# Patient Record
Sex: Female | Born: 1945 | Race: White | Hispanic: No | Marital: Married | Smoking: Never smoker
Health system: Southern US, Community
[De-identification: ages and names within clinical notes are randomized; demographics above are authoritative.]

## PROBLEM LIST (undated history)

## (undated) DIAGNOSIS — R42 Dizziness and giddiness: Secondary | ICD-10-CM

## (undated) DIAGNOSIS — I639 Cerebral infarction, unspecified: Secondary | ICD-10-CM

## (undated) DIAGNOSIS — K219 Gastro-esophageal reflux disease without esophagitis: Secondary | ICD-10-CM

## (undated) DIAGNOSIS — I10 Essential (primary) hypertension: Secondary | ICD-10-CM

## (undated) DIAGNOSIS — E785 Hyperlipidemia, unspecified: Secondary | ICD-10-CM

## (undated) DIAGNOSIS — J189 Pneumonia, unspecified organism: Secondary | ICD-10-CM

## (undated) DIAGNOSIS — A09 Infectious gastroenteritis and colitis, unspecified: Secondary | ICD-10-CM

## (undated) DIAGNOSIS — M1990 Unspecified osteoarthritis, unspecified site: Secondary | ICD-10-CM

## (undated) DIAGNOSIS — F329 Major depressive disorder, single episode, unspecified: Secondary | ICD-10-CM

## (undated) DIAGNOSIS — F32A Depression, unspecified: Secondary | ICD-10-CM

## (undated) DIAGNOSIS — B0229 Other postherpetic nervous system involvement: Secondary | ICD-10-CM

## (undated) DIAGNOSIS — M797 Fibromyalgia: Secondary | ICD-10-CM

## (undated) DIAGNOSIS — F419 Anxiety disorder, unspecified: Secondary | ICD-10-CM

## (undated) HISTORY — PX: TUBAL LIGATION: SHX77

## (undated) HISTORY — DX: Hyperlipidemia, unspecified: E78.5

## (undated) HISTORY — DX: Infectious gastroenteritis and colitis, unspecified: A09

## (undated) HISTORY — DX: Essential (primary) hypertension: I10

## (undated) HISTORY — DX: Depression, unspecified: F32.A

## (undated) HISTORY — DX: Other postherpetic nervous system involvement: B02.29

## (undated) HISTORY — DX: Major depressive disorder, single episode, unspecified: F32.9

---

## 2009-07-03 ENCOUNTER — Ambulatory Visit (HOSPITAL_COMMUNITY): Admission: RE | Admit: 2009-07-03 | Discharge: 2009-07-03 | Payer: Self-pay | Admitting: Internal Medicine

## 2009-07-25 HISTORY — PX: OTHER SURGICAL HISTORY: SHX169

## 2009-08-10 ENCOUNTER — Ambulatory Visit (HOSPITAL_COMMUNITY): Admission: RE | Admit: 2009-08-10 | Discharge: 2009-08-10 | Payer: Self-pay | Admitting: Neurosurgery

## 2012-12-02 ENCOUNTER — Other Ambulatory Visit: Payer: Self-pay | Admitting: Obstetrics and Gynecology

## 2012-12-28 ENCOUNTER — Encounter (HOSPITAL_COMMUNITY): Payer: Self-pay

## 2012-12-28 ENCOUNTER — Encounter (HOSPITAL_COMMUNITY)
Admission: RE | Admit: 2012-12-28 | Discharge: 2012-12-28 | Disposition: A | Discharge status: Home / Self Care | Payer: Medicare Other | Source: Ambulatory Visit | Attending: Obstetrics and Gynecology | Admitting: Obstetrics and Gynecology

## 2012-12-28 ENCOUNTER — Other Ambulatory Visit: Payer: Self-pay

## 2012-12-28 DIAGNOSIS — Z01818 Encounter for other preprocedural examination: Secondary | ICD-10-CM | POA: Insufficient documentation

## 2012-12-28 DIAGNOSIS — Z01812 Encounter for preprocedural laboratory examination: Secondary | ICD-10-CM | POA: Insufficient documentation

## 2012-12-28 HISTORY — DX: Fibromyalgia: M79.7

## 2012-12-28 HISTORY — DX: Gastro-esophageal reflux disease without esophagitis: K21.9

## 2012-12-28 HISTORY — DX: Anxiety disorder, unspecified: F41.9

## 2012-12-28 HISTORY — DX: Unspecified osteoarthritis, unspecified site: M19.90

## 2012-12-28 LAB — CBC
HCT: 37.7 % (ref 36.0–46.0)
Hemoglobin: 13.6 g/dL (ref 12.0–15.0)
MCHC: 36.1 g/dL — ABNORMAL HIGH (ref 30.0–36.0)
MCV: 90 fL (ref 78.0–100.0)
RBC: 4.19 MIL/uL (ref 3.87–5.11)

## 2012-12-28 LAB — BASIC METABOLIC PANEL
BUN: 16 mg/dL (ref 6–23)
CO2: 30 mEq/L (ref 19–32)
Chloride: 83 mEq/L — ABNORMAL LOW (ref 96–112)
Glucose, Bld: 143 mg/dL — ABNORMAL HIGH (ref 70–99)
Potassium: 2.9 mEq/L — ABNORMAL LOW (ref 3.5–5.1)
Sodium: 123 mEq/L — ABNORMAL LOW (ref 135–145)

## 2012-12-28 LAB — SURGICAL PCR SCREEN: Staphylococcus aureus: NEGATIVE

## 2012-12-28 NOTE — Patient Instructions (Addendum)
Your procedure is scheduled on:01/02/13  Enter through the Main Entrance at :0700am Pick up desk phone and dial [phone] and inform us of your arrival.  Please call [phone] if you have any problems the morning of surgery.  Remember: Do not eat or drink after midnight:Tuesday  You may brush your teeth the morning of surgery.  Take these meds the morning of surgery with a sip of water:BP med  DO NOT wear jewelry, eye make-up, lipstick,body lotion, or dark fingernail polish.  (Polished toes are ok)   If you are to be admitted after surgery, leave suitcase in car until your room has been assigned. Patients discharged on the day of surgery will not be allowed to drive home. Wear loose fitting, comfortable clothes for your ride home.

## 2012-12-31 NOTE — Pre-Procedure Instructions (Signed)
Abnormal lab results reported to Dr. Sheral Apley, who wants pt to get clearance from med doctor re low Na, and K+. I notified Stacy at Dr. Kittie Plater office and she will make Dr. Henderson Cloud aware.

## 2012-12-31 NOTE — H&P (Addendum)
67 y.o. yo complains of R ovarian cyst.  Initially it was found on an MRI for back pain.  Pt has had some abdominal cramping and pain.  She has not had any bleeding.  On subsequent [place] the LO was 6.7x6.9x6.9 cm with a 6.6 cm simple cyst without increase in blood flow.  CA 125 is 14.2 and HE was 57, both normal.  EM was 7.22 but EMB was completely benign.    The patient also c/o SUI and urge incontinence.  She had cystometrics done which did show some mixed incontinence but nto straightforward SUI and the urologist did not think she would benefit from TVT.  She understands and wishes to have the ovarian cys/ovary removed now.  Because the uterus is suspended and probably not contributing to her incontinence.  Past Medical History  Diagnosis Date  . Hypertension   . Anxiety   . Mental disorder   . GERD (gastroesophageal reflux disease)     no meds  . Fibromyalgia   . Arthritis     neck   Past Surgical History  Procedure Laterality Date  . Right shoulder  2011  . Tubal ligation    . Bladder repair      History   Social History  . Marital Status: Married    Spouse Name: N/A    Number of Children: N/A  . Years of Education: N/A   Occupational History  . Not on file.   Social History Main Topics  . Smoking status: Never Smoker   . Smokeless tobacco: Not on file  . Alcohol Use: Yes     Comment: occasionally  . Drug Use: No  . Sexually Active: Not on file   Other Topics Concern  . Not on file   Social History Narrative  . No narrative on file    No current facility-administered medications on file prior to encounter.   No current outpatient prescriptions on file prior to encounter.    Allergies  Allergen Reactions  . Statins Other (See Comments)    Muscle aches     @VITALS2 @  Lungs: clear to ascultation Cor:  RRR Abdomen:  soft, nontender, nondistended. Ex:  no cords, erythema Pelvic:  7 cm uterus, without masses felt.  Cystocele -1.  A:  67 yo with 6 cm  simple cyst causing pain desires bilateral salpingoopherectomy withou hysterectomy.   P:  For Robo BSO.   All risks, benefits and alternatives d/w patient and she desires to proceed .  Patient has undergone a modified bowel prep and will receive preop antibiotics and SCDs during the operation.     HORVATH,MICHELLE A  No changes since H&P done.  Will proceed as planned.

## 2013-01-30 ENCOUNTER — Ambulatory Visit (HOSPITAL_COMMUNITY): Payer: Medicare Other | Admitting: Anesthesiology

## 2013-01-30 ENCOUNTER — Ambulatory Visit (HOSPITAL_COMMUNITY)
Admission: RE | Admit: 2013-01-30 | Discharge: 2013-01-30 | Disposition: A | Discharge status: Home / Self Care | Payer: Medicare Other | Source: Ambulatory Visit | Attending: Obstetrics and Gynecology | Admitting: Obstetrics and Gynecology

## 2013-01-30 ENCOUNTER — Encounter (HOSPITAL_COMMUNITY): Payer: Self-pay | Admitting: *Deleted

## 2013-01-30 ENCOUNTER — Encounter (HOSPITAL_COMMUNITY)
Admission: RE | Disposition: A | Discharge status: Home / Self Care | Payer: Self-pay | Source: Ambulatory Visit | Attending: Obstetrics and Gynecology

## 2013-01-30 ENCOUNTER — Encounter (HOSPITAL_COMMUNITY): Payer: Self-pay | Admitting: Anesthesiology

## 2013-01-30 DIAGNOSIS — K219 Gastro-esophageal reflux disease without esophagitis: Secondary | ICD-10-CM | POA: Insufficient documentation

## 2013-01-30 DIAGNOSIS — I10 Essential (primary) hypertension: Secondary | ICD-10-CM | POA: Insufficient documentation

## 2013-01-30 DIAGNOSIS — N83209 Unspecified ovarian cyst, unspecified side: Secondary | ICD-10-CM | POA: Insufficient documentation

## 2013-01-30 HISTORY — PX: ROBOTIC ASSISTED BILATERAL SALPINGO OOPHERECTOMY: SHX6078

## 2013-01-30 LAB — BASIC METABOLIC PANEL
CO2: 27 mEq/L (ref 19–32)
Calcium: 9.9 mg/dL (ref 8.4–10.5)
Chloride: 95 mEq/L — ABNORMAL LOW (ref 96–112)
Creatinine, Ser: 0.82 mg/dL (ref 0.50–1.10)
Glucose, Bld: 108 mg/dL — ABNORMAL HIGH (ref 70–99)
Sodium: 134 mEq/L — ABNORMAL LOW (ref 135–145)

## 2013-01-30 LAB — CBC
Hemoglobin: 14.3 g/dL (ref 12.0–15.0)
MCH: 32.4 pg (ref 26.0–34.0)
MCV: 91.9 fL (ref 78.0–100.0)
RBC: 4.42 MIL/uL (ref 3.87–5.11)
WBC: 7 10*3/uL (ref 4.0–10.5)

## 2013-01-30 SURGERY — ROBOTIC ASSISTED BILATERAL SALPINGO OOPHORECTOMY
Anesthesia: General | Site: Abdomen | Laterality: Bilateral | Wound class: Clean Contaminated

## 2013-01-30 MED ORDER — IBUPROFEN 600 MG PO TABS
600.0000 mg | ORAL_TABLET | Freq: Once | ORAL | Status: AC
  Administered 2013-01-30: 600 mg via ORAL

## 2013-01-30 MED ORDER — MIDAZOLAM HCL 5 MG/5ML IJ SOLN
INTRAMUSCULAR | Status: DC | PRN
  Administered 2013-01-30 (×2): 1 mg via INTRAVENOUS

## 2013-01-30 MED ORDER — OXYCODONE-ACETAMINOPHEN 5-325 MG PO TABS: 1.0000 | ORAL_TABLET | ORAL | Status: DC | PRN

## 2013-01-30 MED ORDER — LIDOCAINE HCL (CARDIAC) 20 MG/ML IV SOLN
INTRAVENOUS | Status: AC
  Filled 2013-01-30: qty 5

## 2013-01-30 MED ORDER — FENTANYL CITRATE 0.05 MG/ML IJ SOLN
INTRAMUSCULAR | Status: DC | PRN
  Administered 2013-01-30 (×4): 50 ug via INTRAVENOUS

## 2013-01-30 MED ORDER — PROMETHAZINE HCL 25 MG/ML IJ SOLN: 6.2500 mg | INTRAMUSCULAR | Status: DC | PRN

## 2013-01-30 MED ORDER — IBUPROFEN 600 MG PO TABS
ORAL_TABLET | ORAL | Status: AC
  Filled 2013-01-30: qty 1

## 2013-01-30 MED ORDER — OXYCODONE-ACETAMINOPHEN 5-325 MG PO TABS
ORAL_TABLET | ORAL | Status: AC
  Filled 2013-01-30: qty 2

## 2013-01-30 MED ORDER — BUPIVACAINE HCL (PF) 0.25 % IJ SOLN
INTRAMUSCULAR | Status: DC | PRN
  Administered 2013-01-30: 4 mL

## 2013-01-30 MED ORDER — NEOSTIGMINE METHYLSULFATE 1 MG/ML IJ SOLN
INTRAMUSCULAR | Status: DC | PRN
  Administered 2013-01-30: 3 mg via INTRAVENOUS

## 2013-01-30 MED ORDER — KETOROLAC TROMETHAMINE 30 MG/ML IJ SOLN: 15.0000 mg | Freq: Once | INTRAMUSCULAR | Status: DC | PRN

## 2013-01-30 MED ORDER — ROCURONIUM BROMIDE 100 MG/10ML IV SOLN
INTRAVENOUS | Status: DC | PRN
  Administered 2013-01-30: 50 mg via INTRAVENOUS
  Administered 2013-01-30: 15 mg via INTRAVENOUS

## 2013-01-30 MED ORDER — MIDAZOLAM HCL 2 MG/2ML IJ SOLN
INTRAMUSCULAR | Status: AC
  Filled 2013-01-30: qty 2

## 2013-01-30 MED ORDER — CEFAZOLIN SODIUM-DEXTROSE 2-3 GM-% IV SOLR
INTRAVENOUS | Status: AC
  Filled 2013-01-30: qty 50

## 2013-01-30 MED ORDER — MIDAZOLAM HCL 2 MG/2ML IJ SOLN: 0.5000 mg | Freq: Once | INTRAMUSCULAR | Status: DC | PRN

## 2013-01-30 MED ORDER — PROPOFOL 10 MG/ML IV BOLUS
INTRAVENOUS | Status: DC | PRN
  Administered 2013-01-30: 150 mg via INTRAVENOUS

## 2013-01-30 MED ORDER — PROPOFOL 10 MG/ML IV EMUL
INTRAVENOUS | Status: AC
  Filled 2013-01-30: qty 20

## 2013-01-30 MED ORDER — ARTIFICIAL TEARS OP OINT
TOPICAL_OINTMENT | OPHTHALMIC | Status: AC
  Filled 2013-01-30: qty 3.5

## 2013-01-30 MED ORDER — ONDANSETRON HCL 4 MG/2ML IJ SOLN
INTRAMUSCULAR | Status: AC
  Filled 2013-01-30: qty 2

## 2013-01-30 MED ORDER — PHENYLEPHRINE HCL 10 MG/ML IJ SOLN: INTRAMUSCULAR | Status: DC | PRN

## 2013-01-30 MED ORDER — ROCURONIUM BROMIDE 50 MG/5ML IV SOLN
INTRAVENOUS | Status: AC
  Filled 2013-01-30: qty 1

## 2013-01-30 MED ORDER — GLYCOPYRROLATE 0.2 MG/ML IJ SOLN
INTRAMUSCULAR | Status: AC
  Filled 2013-01-30: qty 2

## 2013-01-30 MED ORDER — GLYCOPYRROLATE 0.2 MG/ML IJ SOLN
INTRAMUSCULAR | Status: DC | PRN
  Administered 2013-01-30: .4 mg via INTRAVENOUS

## 2013-01-30 MED ORDER — PHENYLEPHRINE HCL 10 MG/ML IJ SOLN
INTRAMUSCULAR | Status: DC | PRN
  Administered 2013-01-30: 80 ug via INTRAVENOUS
  Administered 2013-01-30 (×3): 40 ug via INTRAVENOUS
  Administered 2013-01-30: 80 ug via INTRAVENOUS

## 2013-01-30 MED ORDER — OXYCODONE-ACETAMINOPHEN 5-325 MG PO TABS
1.0000 | ORAL_TABLET | ORAL | Status: DC | PRN
  Administered 2013-01-30: 1 via ORAL

## 2013-01-30 MED ORDER — FENTANYL CITRATE 0.05 MG/ML IJ SOLN
INTRAMUSCULAR | Status: AC
  Filled 2013-01-30: qty 5

## 2013-01-30 MED ORDER — LACTATED RINGERS IR SOLN
Status: DC | PRN
  Administered 2013-01-30: 3000 mL

## 2013-01-30 MED ORDER — MEPERIDINE HCL 25 MG/ML IJ SOLN: 6.2500 mg | INTRAMUSCULAR | Status: DC | PRN

## 2013-01-30 MED ORDER — HYDROMORPHONE HCL PF 1 MG/ML IJ SOLN: 0.2500 mg | INTRAMUSCULAR | Status: DC | PRN

## 2013-01-30 MED ORDER — LIDOCAINE HCL (CARDIAC) 20 MG/ML IV SOLN
INTRAVENOUS | Status: DC | PRN
  Administered 2013-01-30: 50 mg via INTRAVENOUS

## 2013-01-30 MED ORDER — BUPIVACAINE HCL (PF) 0.25 % IJ SOLN
INTRAMUSCULAR | Status: AC
  Filled 2013-01-30: qty 30

## 2013-01-30 MED ORDER — LACTATED RINGERS IV SOLN
INTRAVENOUS | Status: DC
  Administered 2013-01-30 (×2): via INTRAVENOUS

## 2013-01-30 MED ORDER — PHENYLEPHRINE 40 MCG/ML (10ML) SYRINGE FOR IV PUSH (FOR BLOOD PRESSURE SUPPORT)
PREFILLED_SYRINGE | INTRAVENOUS | Status: AC
  Filled 2013-01-30: qty 10

## 2013-01-30 MED ORDER — OXYCODONE-ACETAMINOPHEN 5-325 MG PO TABS
ORAL_TABLET | ORAL | Status: AC
  Administered 2013-01-30: 1 via ORAL
  Filled 2013-01-30: qty 1

## 2013-01-30 MED ORDER — CEFAZOLIN SODIUM-DEXTROSE 2-3 GM-% IV SOLR
2.0000 g | INTRAVENOUS | Status: AC
  Administered 2013-01-30: 2 g via INTRAVENOUS

## 2013-01-30 MED ORDER — ONDANSETRON HCL 4 MG/2ML IJ SOLN
INTRAMUSCULAR | Status: DC | PRN
  Administered 2013-01-30: 4 mg via INTRAVENOUS

## 2013-01-30 SURGICAL SUPPLY — 63 items
BAG URINE DRAINAGE (UROLOGICAL SUPPLIES) ×2 IMPLANT
BARRIER ADHS 3X4 INTERCEED (GAUZE/BANDAGES/DRESSINGS) ×4 IMPLANT
BENZOIN TINCTURE PRP APPL 2/3 (GAUZE/BANDAGES/DRESSINGS) ×2 IMPLANT
CATH FOLEY 2WAY SLVR  5CC 16FR (CATHETERS) ×1
CATH FOLEY 2WAY SLVR 5CC 16FR (CATHETERS) ×1 IMPLANT
CATH FOLEY 3WAY  5CC 16FR (CATHETERS) ×1
CATH FOLEY 3WAY 5CC 16FR (CATHETERS) ×1 IMPLANT
CHLORAPREP W/TINT 26ML (MISCELLANEOUS) ×2 IMPLANT
CLOTH BEACON ORANGE TIMEOUT ST (SAFETY) ×2 IMPLANT
CONT PATH 16OZ SNAP LID 3702 (MISCELLANEOUS) ×2 IMPLANT
COVER MAYO STAND STRL (DRAPES) ×2 IMPLANT
COVER TABLE BACK 60X90 (DRAPES) ×4 IMPLANT
COVER TIP SHEARS 8 DVNC (MISCELLANEOUS) ×1 IMPLANT
COVER TIP SHEARS 8MM DA VINCI (MISCELLANEOUS) ×1
DECANTER SPIKE VIAL GLASS SM (MISCELLANEOUS) ×2 IMPLANT
DERMABOND ADVANCED (GAUZE/BANDAGES/DRESSINGS) ×1
DERMABOND ADVANCED .7 DNX12 (GAUZE/BANDAGES/DRESSINGS) ×1 IMPLANT
DILATOR CANAL MILEX (MISCELLANEOUS) ×2 IMPLANT
DRAPE HUG U DISPOSABLE (DRAPE) ×2 IMPLANT
DRAPE LG THREE QUARTER DISP (DRAPES) ×4 IMPLANT
DRAPE WARM FLUID 44X44 (DRAPE) ×2 IMPLANT
ELECT REM PT RETURN 9FT ADLT (ELECTROSURGICAL) ×2
ELECTRODE REM PT RTRN 9FT ADLT (ELECTROSURGICAL) ×1 IMPLANT
EVACUATOR SMOKE 8.L (FILTER) ×2 IMPLANT
GAUZE VASELINE 3X9 (GAUZE/BANDAGES/DRESSINGS) IMPLANT
GLOVE BIO SURGEON STRL SZ7 (GLOVE) ×4 IMPLANT
GLOVE ECLIPSE 6.5 STRL STRAW (GLOVE) ×6 IMPLANT
GOWN STRL REIN XL XLG (GOWN DISPOSABLE) ×12 IMPLANT
KIT ACCESSORY DA VINCI DISP (KITS) ×1
KIT ACCESSORY DVNC DISP (KITS) ×1 IMPLANT
LEGGING LITHOTOMY PAIR STRL (DRAPES) ×2 IMPLANT
MANIPULATOR UTERINE 4.5 ZUMI (MISCELLANEOUS) ×2 IMPLANT
NEEDLE INSUFFLATION 120MM (ENDOMECHANICALS) ×2 IMPLANT
OCCLUDER COLPOPNEUMO (BALLOONS) ×4 IMPLANT
PACK LAVH (CUSTOM PROCEDURE TRAY) ×2 IMPLANT
PAD PREP 24X48 CUFFED NSTRL (MISCELLANEOUS) ×4 IMPLANT
PLUG CATH AND CAP STER (CATHETERS) ×2 IMPLANT
POUCH SPECIMEN RETRIEVAL 10MM (ENDOMECHANICALS) ×2 IMPLANT
PROTECTOR NERVE ULNAR (MISCELLANEOUS) ×4 IMPLANT
SET CYSTO W/LG BORE CLAMP LF (SET/KITS/TRAYS/PACK) IMPLANT
SET IRRIG TUBING LAPAROSCOPIC (IRRIGATION / IRRIGATOR) ×2 IMPLANT
SOLUTION ELECTROLUBE (MISCELLANEOUS) ×2 IMPLANT
STRIP CLOSURE SKIN 1/2X4 (GAUZE/BANDAGES/DRESSINGS) ×2 IMPLANT
SUT VIC AB 0 CT1 27 (SUTURE) ×5
SUT VIC AB 0 CT1 27XBRD ANBCTR (SUTURE) ×5 IMPLANT
SUT VIC AB 2-0 CT2 27 (SUTURE) ×4 IMPLANT
SUT VICRYL 0 UR6 27IN ABS (SUTURE) ×4 IMPLANT
SUT VICRYL RAPIDE 3 0 (SUTURE) ×4 IMPLANT
SYR 50ML LL SCALE MARK (SYRINGE) ×2 IMPLANT
SYSTEM CONVERTIBLE TROCAR (TROCAR) ×2 IMPLANT
TIP RUMI ORANGE 6.7MMX12CM (TIP) IMPLANT
TIP UTERINE 5.1X6CM LAV DISP (MISCELLANEOUS) IMPLANT
TIP UTERINE 6.7X10CM GRN DISP (MISCELLANEOUS) IMPLANT
TIP UTERINE 6.7X6CM WHT DISP (MISCELLANEOUS) IMPLANT
TIP UTERINE 6.7X8CM BLUE DISP (MISCELLANEOUS) IMPLANT
TOWEL OR 17X24 6PK STRL BLUE (TOWEL DISPOSABLE) ×4 IMPLANT
TROCAR 12M 150ML BLUNT (TROCAR) ×2 IMPLANT
TROCAR DILATING TIP 12MM 150MM (ENDOMECHANICALS) IMPLANT
TROCAR DISP BLADELESS 8 DVNC (TROCAR) ×1 IMPLANT
TROCAR DISP BLADELESS 8MM (TROCAR) ×1
TROCAR XCEL 12X100 BLDLESS (ENDOMECHANICALS) ×2 IMPLANT
TUBING FILTER THERMOFLATOR (ELECTROSURGICAL) ×2 IMPLANT
WATER STERILE IRR 1000ML POUR (IV SOLUTION) ×6 IMPLANT

## 2013-01-30 NOTE — Brief Op Note (Signed)
01/30/2013  3:11 PM  PATIENT:  Alexis Barnes  67 y.o. female  PRE-OPERATIVE DIAGNOSIS:  OVARIAN CYST 620.2  POST-OPERATIVE DIAGNOSIS:  ovarian cyst  PROCEDURE:  Procedure(s): ROBOTIC ASSISTED BILATERAL SALPINGO OOPHORECTOMY (Bilateral)  SURGEON:  Surgeon(s) and Role:    * Philip Aspen, DO - Primary    * W Lodema Hong, MD - Assisting   ANESTHESIA:   local and general  EBL:  Total I/O In: 1600 [I.V.:1600] Out: 510 [Urine:500; Blood:10]  BLOOD ADMINISTERED:none  LOCAL MEDICATIONS USED:  MARCAINE     SPECIMEN:  Source of Specimen:  washings, bilateral tubes and ovaries with L ovarian cyst  DISPOSITION OF SPECIMEN:  PATHOLOGY  COUNTS:  YES  TOURNIQUET:  * No tourniquets in log *   PLAN OF CARE: Discharge to home after PACU  PATIENT DISPOSITION:  PACU - hemodynamically stable.

## 2013-01-30 NOTE — Transfer of Care (Signed)
Immediate Anesthesia Transfer of Care Note  Patient: Alexis Barnes  Procedure(s) Performed: Procedure(s): ROBOTIC ASSISTED BILATERAL SALPINGO OOPHORECTOMY (Bilateral)  Patient Location: PACU  Anesthesia Type:General  Level of Consciousness: awake, alert  and oriented  Airway & Oxygen Therapy: Patient Spontanous Breathing and Patient connected to nasal cannula oxygen  Post-op Assessment: Report given to PACU RN and Post -op Vital signs reviewed and stable  Post vital signs: stable  Complications: No apparent anesthesia complications

## 2013-01-30 NOTE — Op Note (Signed)
Patient was bladder scanned and 31 cc of urine were found in bladder.

## 2013-01-30 NOTE — Discharge Instructions (Signed)
Please call Dr. Claiborne Billings with any problems or questions.  You may have some vaginal bleeding/spotting for several days after the procedure, this is normal.  Please see printed discharge instructions. DISCHARGE INSTRUCTIONS: GYN LAPAROSCOPY The following instructions have been prepared to help you care for yourself upon your return home today. Wound care:  Do not get the incision wet for the first 24 hours. The incision should be kept clean and dry.  The Band-Aids or dressings may be removed the day after surgery.  Should the incision become sore, red, and swollen after the first week, check with your doctor. Personal hygiene:  Use sanitary pads, not tampons.  Shower the day after your procedure.  NO tub baths, pools, or Jacuzzis for 2-3 weeks.  Wipe front to back after using the bathroom. Activity and limitations:  Do NOT drive or operate any equipment today.  Do NOT lift anything more than 15 pounds for 2-3 weeks after surgery.  Do NOT rest in bed all day.  Walking is encouraged. Walk each day, starting slowly with 5-minute walks 3 or 4 times a day. Slowly increase the length of your walks.  Walk up and down stairs slowly.  Do NOT do strenuous activities, such as golfing, playing tennis, bowling, running, biking, weight lifting, gardening, mowing, or vacuuming for 2-4 weeks. Ask your doctor when it is okay to start. Sexual activity: NO intercourse for at least 2 weeks after the procedure. If your laparoscopy was for sterilization (tubes tied), continue current method of birth control until after your next period or ask for specific instructions from your doctor. Diet: Eat a light meal as desired this evening. You may resume your usual diet tomorrow. Return to work: This is dependent on the type of work you do. For the most part you can return to a desk job within a week of surgery. If you are more active at work, please discuss this with your doctor. What to expect after  your surgery: You may have a slight burning sensation when you urinate on the first day. You may have a very small amount of blood in the urine. Expect to have a small amount of vaginal discharge/light bleeding for 1-2 weeks. It is not unusual to have abdominal soreness and bruising for up to 2 weeks. You may be tired and need more rest for about 1 week. You may experience shoulder pain for 24-72 hours. Lying flat in bed may relieve it. Call your doctor for any of the following:  Excessive vaginal bleeding, saturating a pad in less than 2-3 hours  Develop a fever of 100.4 or greater  Inability to urinate 6 hours after discharge from hospital  Unusual vaginal discharge or odor  Severe pain not relieved by pain medications  Persistent of heavy bleeding at incision site  Redness or swelling around incision site after a week  Increasing nausea or vomiting Return to office ___________________________ Call for an appointment ____________________ Additional information: _______________________________________________________________ ___________________________________________________________________________________ Physicians signature: _____________________ Patients signature: ________________________ ________________________________________ Nurses signature _________________________ Post Anesthesia Care Unit [phone] [facility] COUNTY COMMUNITY RESOURCES: *IF YOU ARE IN IMMEDIATE DANGER CALL 911! Abuse/Neglect: Family Services Crisis Hotline [facility]): ............................................................................... [phone] National Domestic Violence Hotline: ....................................................................................................Marland Kitchen [phone] Mental Health: [facility] Mental Health: [facility] Ip.:................................................................................... [phone] Health  Clinics: [facility]) ....................................................................................... [phone] Monday - Friday: 8:00 a.m. - 9:00 p.m.  Saturday - Sunday: 10:00 a.m. - 9:00 p.m. [facility] - [place]: .................................................................................................... [phone]  Monday - Friday: 8:00 a.m. - 5:00 p.m. [facility] [address]: ................................................................................................. [phone] Monday- Friday: 8:30 a.m. - 5:30 p.m.  Saturday: 9:00 a.m. - 1:00 p.m. [place] COMMUNITY RESOURCES: *IF YOU ARE IN IMMEDIATE DANGER CALL 911! Abuse/Neglect: [facility] Against [facility]): ................................................................................. [phone] After hours, holidays and weekends: ............................................................................................ [phone] [facility] Social Services .......................................................................... [phone] [employer] (domestic violence): .......................................................................................................... [phone] National Domestic Violence Hotline: ....................................................................................................Marland Kitchen 202-124-4996 Mental Health: [facility] Mental Health: .................................................................................................... [phone] Health Clinics: [facility]: .......................................................................................................................... [phone] [facility] Department:  ............................................................................................ [phone] Community Resources: [facility] ................................................................................................................. [phone] Council on Aging ................................................................................................................................ [phone] Help for the Homeless ......................................................................................................................... [phone] [facility] ..................................................................................................... [phone] [employer] ................................................................................................................................... [phone]

## 2013-01-30 NOTE — Anesthesia Postprocedure Evaluation (Signed)
  Anesthesia Post-op Note  Anesthesia Post Note  Patient: Alexis Barnes  Procedure(s) Performed: Procedure(s) (LRB): ROBOTIC ASSISTED BILATERAL SALPINGO OOPHORECTOMY (Bilateral)  Anesthesia type: General  Patient location: PACU  Post pain: Pain level controlled  Post assessment: Post-op Vital signs reviewed  Last Vitals:  Filed Vitals:   01/30/13 1600  BP: 110/50  Pulse: 63  Temp:   Resp: 24    Post vital signs: Reviewed  Level of consciousness: sedated  Complications: No apparent anesthesia complications.  Patient was unable to urinate prior to discharge.  Bladder scan by PACU RN showed 31 ml of urine in bladder.  Patient instructed to drink lots of PO fluids and return to hospital in 6 hours if unable to urinate.  Jasmine December, MD

## 2013-01-30 NOTE — Preoperative (Signed)
Beta Blockers   Reason not to administer Beta Blockers:Not Applicable 

## 2013-01-30 NOTE — Discharge Summary (Signed)
  Pt here for same day surgery, underwent RA-BSO which was uncomplicated, please see op report for details.  Anticipate d/c home after PACU recovery with planned office f/u in 2 weeks.

## 2013-01-30 NOTE — Anesthesia Preprocedure Evaluation (Addendum)
Anesthesia Evaluation  Patient identified by MRN, date of birth, ID band Patient awake    Reviewed: Allergy & Precautions, H&P , Patient's Chart, lab work & pertinent test results, reviewed documented beta blocker date and time   History of Anesthesia Complications Negative for: history of anesthetic complications  Airway Mallampati: II TM Distance: >3 FB Neck ROM: full    Dental no notable dental hx.    Pulmonary neg pulmonary ROS,  breath sounds clear to auscultation  Pulmonary exam normal       Cardiovascular Exercise Tolerance: Good hypertension, negative cardio ROS  Rhythm:regular Rate:Normal     Neuro/Psych PSYCHIATRIC DISORDERS Anxiety  Neuromuscular disease negative neurological ROS  negative psych ROS   GI/Hepatic negative GI ROS, Neg liver ROS, GERD-  Controlled,  Endo/Other  negative endocrine ROS  Renal/GU negative Renal ROS     Musculoskeletal  (+) Fibromyalgia -  Abdominal   Peds  Hematology negative hematology ROS (+)   Anesthesia Other Findings Hypertension     Anxiety        Mental disorder     GERD (gastroesophageal reflux disease)   no meds    Fibromyalgia     Arthritis   neck    Reproductive/Obstetrics negative OB ROS                           Anesthesia Physical Anesthesia Plan  ASA: II  Anesthesia Plan: General ETT   Post-op Pain Management:    Induction:   Airway Management Planned: Video Laryngoscope Planned  Additional Equipment:   Intra-op Plan:   Post-operative Plan:   Informed Consent: I have reviewed the patients History and Physical, chart, labs and discussed the procedure including the risks, benefits and alternatives for the proposed anesthesia with the patient or authorized representative who has indicated his/her understanding and acceptance.   Dental Advisory Given  Plan Discussed with: CRNA and Surgeon  Anesthesia Plan Comments:         Anesthesia Quick Evaluation

## 2013-01-31 ENCOUNTER — Encounter (HOSPITAL_COMMUNITY): Payer: Self-pay | Admitting: Obstetrics and Gynecology

## 2013-01-31 MED FILL — Heparin Sodium (Porcine) Inj 5000 Unit/ML: INTRAMUSCULAR | Qty: 1 | Status: AC

## 2013-02-06 NOTE — Op Note (Signed)
NAME:  DORALYN, KIRKES NO.:  MEDICAL RECORD NO.:  [id number]  LOCATION:                                 FACILITY:  PHYSICIAN:  Philip Aspen, DO    DATE OF BIRTH:  [birth year]  DATE OF PROCEDURE: DATE OF DISCHARGE:                              OPERATIVE REPORT   PREOPERATIVE DIAGNOSIS:  Left ovarian cyst.  POSTOPERATIVE DIAGNOSIS:  Left ovarian cyst.  PROCEDURE:  Robotic-assisted bilateral salpingo-oophorectomy.  SURGEON:  Philip Aspen, DO  ASSISTANT:  Nickie Retort, MD  ANESTHESIA:  General and local.  ESTIMATED BLOOD LOSS:  10 mL.  URINE OUTPUT:  500 mL.  INTRAVENOUS FLUIDS:  1600 mL.  LOCAL ANESTHETIC USED AT SKIN INCISION:  Marcaine.  SPECIMENS:  Washings, bilateral tubes and ovaries with left ovarian cysts.  FINDINGS:  Normal-appearing uterus and right adnexa, large left ovarian cyst.  No other abnormal findings.  COMPLICATIONS:  None.  CONDITION:  Stable to PACU.  DESCRIPTION OF PROCEDURE:  The patient was taken to the operating room, where general anesthesia was administered and found to be adequate.  She was then prepped and draped in the normal sterile fashion in dorsal lithotomy position.  A speculum was placed in the patient's vagina after bimanual examination.  The anterior lip of the cervix was grasped with a single-tooth tenaculum.  The cervix was sounded to approximately 8 cm and serially dilated to 25 Pratt.  A ZUMI uterine manipulator was entered through the cervical canal and bulb insufflated.  Single-tooth tenaculum was removed and proper placement was confirmed.  Attention was turned to the abdomen, where approximately 12 mm incision was made after placement of Marcaine supraumbilically in a horizontal fashion with a scalpel.  A Veress needle was entered, and saline drop test confirmed entrance through the peritoneum.  Cath was placed on low flow.  Low pressure was noted and increased to high flow.  The abdomen  was insufflated sufficiently, and the Veress needle was removed.  An Optiview trocar was used to visualize entrance into the abdomen, which was surveyed.  Two additional ports were placed approximately 10 cm laterally and slightly caudally.  These were approximately 8 mm incisions that were made with a scalpel through the skin, after Marcaine was placed.  Trocars were entered with direct visualization.  An additional 5-mm port was placed in the right lower quadrant under direct visualization.  The robotic arms were then attached after placing the patient in Trendelenburg.  The ureters were observed bilaterally. Attention was turned to the right adnexa.  The IP was identified, and bipolar cautery was used to transect followed by monopolar scissors. Bipolar cautery was then used to remove the right fallopian tube and utero-ovarian ligament and the separation was extended with bipolar cautery to the IP ligament.  The adnexa was placed in the posterior cul- de-sac.  Attention was then turned to the left side, where in a similar fashion, the IP was isolated.  Bipolar cautery used, followed by monopolar scissors.  Again, the utero-ovarian ligament was also transected with bipolar cautery and separation brought laterally to the initial IP.  All areas were examined and found to be hemostatic. Robotic arms  were detached and 8-mm camera was entered through the left port.  An endobag was used to retrieve the enlarged ovarian cyst complex.  This was brought to the surface, and fluid was removed from the cyst at the level of the skin with the syringe.  Approximately 200 mL of ovarian fluid were removed decompressing sufficiently to assist in intact removal of the left adnexa.  The right adnexa was then removed through the umbilical port without difficulty.  Again, the abdomen was surveyed.  Hemostasis was noted.  The abdomen was desufflated.  The patient was returned to dorsal lithotomy position from  Trendelenburg. All ports were removed.  The umbilical fascia was closed with 2-0 Vicryl and a figure-of-eight.  All port sites were closed with Monocryl subcuticularly.  Subcuticular incisions sites received Dermabond for further closure.  The patient tolerated the procedure well.  Sponge, lap, and needle counts were correct x2.  Specimens were sent to pathology.  Of note, washings were obtained at the start of the case.          ______________________________ Philip Aspen, DO     [place]/MEDQ  D:  02/05/2013  T:  02/06/2013  Job:  [id number]

## 2013-03-01 ENCOUNTER — Other Ambulatory Visit: Payer: Self-pay | Admitting: Obstetrics and Gynecology

## 2013-09-25 ENCOUNTER — Encounter (INDEPENDENT_AMBULATORY_CARE_PROVIDER_SITE_OTHER): Payer: Self-pay | Admitting: *Deleted

## 2013-10-27 ENCOUNTER — Encounter: Payer: Self-pay | Admitting: Cardiology

## 2013-11-21 ENCOUNTER — Encounter (INDEPENDENT_AMBULATORY_CARE_PROVIDER_SITE_OTHER): Payer: Self-pay | Admitting: *Deleted

## 2013-12-11 ENCOUNTER — Other Ambulatory Visit (INDEPENDENT_AMBULATORY_CARE_PROVIDER_SITE_OTHER): Payer: Self-pay | Admitting: *Deleted

## 2013-12-11 ENCOUNTER — Encounter (INDEPENDENT_AMBULATORY_CARE_PROVIDER_SITE_OTHER): Payer: Self-pay | Admitting: Internal Medicine

## 2013-12-11 ENCOUNTER — Encounter (INDEPENDENT_AMBULATORY_CARE_PROVIDER_SITE_OTHER): Payer: Self-pay | Admitting: *Deleted

## 2013-12-11 ENCOUNTER — Ambulatory Visit (INDEPENDENT_AMBULATORY_CARE_PROVIDER_SITE_OTHER): Payer: Medicare Other | Admitting: Internal Medicine

## 2013-12-11 ENCOUNTER — Telehealth (INDEPENDENT_AMBULATORY_CARE_PROVIDER_SITE_OTHER): Payer: Self-pay | Admitting: *Deleted

## 2013-12-11 VITALS — BP 112/64 | HR 60 | Temp 98.4°F | Ht 64.0 in | Wt 180.3 lb

## 2013-12-11 DIAGNOSIS — IMO0001 Reserved for inherently not codable concepts without codable children: Secondary | ICD-10-CM

## 2013-12-11 DIAGNOSIS — Z800 Family history of malignant neoplasm of digestive organs: Secondary | ICD-10-CM

## 2013-12-11 DIAGNOSIS — K219 Gastro-esophageal reflux disease without esophagitis: Secondary | ICD-10-CM | POA: Insufficient documentation

## 2013-12-11 DIAGNOSIS — F329 Major depressive disorder, single episode, unspecified: Secondary | ICD-10-CM | POA: Insufficient documentation

## 2013-12-11 DIAGNOSIS — F3289 Other specified depressive episodes: Secondary | ICD-10-CM

## 2013-12-11 DIAGNOSIS — I10 Essential (primary) hypertension: Secondary | ICD-10-CM

## 2013-12-11 DIAGNOSIS — F32A Depression, unspecified: Secondary | ICD-10-CM

## 2013-12-11 DIAGNOSIS — M797 Fibromyalgia: Secondary | ICD-10-CM | POA: Insufficient documentation

## 2013-12-11 NOTE — Telephone Encounter (Addendum)
Sample of movi prep given to patient

## 2013-12-11 NOTE — Patient Instructions (Signed)
Colonoscopy with Dr. Rehman 

## 2013-12-11 NOTE — Progress Notes (Addendum)
Subjective:     Patient ID: Alexis KinsmanCarolyn H Kranz, female   DOB: 1945-09-17, 68 y.o.   MRN: [id number]  HPI Referred to our office by Dr. Sherryll BurgerShah for a screening colonoscopy. Her last colonoscopy was seven years ago. She underwent for abdominal pain. The colonoscopy was normal (Dr. Gabriel CirrieMason). Family hx of colon cancer in a sister in her 2940s. Appetite is good. No dysphagia. Occasionally acid reflux. She says she has a hiatal hernia.   [facility]. She had diarrhea an her WBC was elevated. [facility]    She did not undergo a colonoscopy because of the colitis. C diff was negative. Stool studies were negative. She was discharged after 7 days. She was diagnosed also with CHF and placed on Lasix. She apparently stayed  [facility] for Re[facility]. She has a BM every day or every other day. No melena or bright red rectal bleeding.   08/16/2013 EGD: [facility] by Dr. Gibson RampNyree Thorne Impressions: 1. Small hiatus hernia at the gastroesophageal junction 2. Otherwise normal EGD.       Review of Systems Past Medical History  Diagnosis Date  . Hypertension   . Anxiety   . Mental disorder     depression  . GERD (gastroesophageal reflux disease)     no meds  . Fibromyalgia   . Arthritis     neck    Past Surgical History  Procedure Laterality Date  . Right shoulder  2011  . Tubal ligation    . Bladder repair    . Vaginal delivery  [id number]  . Robotic assisted bilateral salpingo oopherectomy Bilateral 01/30/2013    Procedure: ROBOTIC ASSISTED BILATERAL SALPINGO OOPHORECTOMY;  Surgeon: Philip AspenSidney Callahan, DO;  Location: [facility];  Service: Gynecology;  Laterality: Bilateral;    Allergies  Allergen Reactions  . Statins Other (See Comments)    Muscle aches     Current Outpatient Prescriptions on File Prior to Visit  Medication Sig Dispense Refill  . lisinopril (PRINIVIL,ZESTRIL) 20 MG tablet  Take 10 mg by mouth daily.       No current facility-administered medications on file prior to visit.    Married, Two children in good health. Retired from [employer].     Objective:   Physical Exam  Filed Vitals:   12/11/13 1457  BP: 112/64  Pulse: 60  Temp: 98.4 F (36.9 C)  Height: 5\' 4"  (1.626 m)  Weight: 180 lb 4.8 oz (81.784 kg)   Alert and oriented. Skin warm and dry. Oral mucosa is moist.   . Sclera anicteric, conjunctivae is pink. Thyroid not enlarged. No cervical lymphadenopathy. Lungs clear. Heart regular rate and rhythm.  Abdomen is soft. Bowel sounds are positive. No hepatomegaly. No abdominal masses felt. No tenderness.  No edema to lower extremities.       Assessment:    Family hx of colon cancer in a sister age 70. Her last colonoscopy was 7 yrs ago by Dr. Gabriel CirrieMason and she reports it was normal. No GI symptoms at this time.    Plan:     Colonoscopy. The risks and benefits such as perforation, bleeding, and infection were reviewed with the patient and is agreeable.

## 2013-12-12 ENCOUNTER — Encounter (HOSPITAL_COMMUNITY): Payer: Self-pay | Admitting: Pharmacy Technician

## 2013-12-27 ENCOUNTER — Encounter (HOSPITAL_COMMUNITY): Payer: Self-pay | Admitting: *Deleted

## 2013-12-27 ENCOUNTER — Encounter (HOSPITAL_COMMUNITY)
Admission: RE | Disposition: A | Discharge status: Home / Self Care | Payer: Self-pay | Source: Ambulatory Visit | Attending: Internal Medicine

## 2013-12-27 ENCOUNTER — Ambulatory Visit (HOSPITAL_COMMUNITY)
Admission: RE | Admit: 2013-12-27 | Discharge: 2013-12-27 | Disposition: A | Discharge status: Home / Self Care | Payer: Medicare Other | Source: Ambulatory Visit | Attending: Internal Medicine | Admitting: Internal Medicine

## 2013-12-27 DIAGNOSIS — K5730 Diverticulosis of large intestine without perforation or abscess without bleeding: Secondary | ICD-10-CM

## 2013-12-27 DIAGNOSIS — Q438 Other specified congenital malformations of intestine: Secondary | ICD-10-CM | POA: Insufficient documentation

## 2013-12-27 DIAGNOSIS — Z79899 Other long term (current) drug therapy: Secondary | ICD-10-CM | POA: Insufficient documentation

## 2013-12-27 DIAGNOSIS — F3289 Other specified depressive episodes: Secondary | ICD-10-CM | POA: Insufficient documentation

## 2013-12-27 DIAGNOSIS — M129 Arthropathy, unspecified: Secondary | ICD-10-CM | POA: Insufficient documentation

## 2013-12-27 DIAGNOSIS — Z800 Family history of malignant neoplasm of digestive organs: Secondary | ICD-10-CM | POA: Insufficient documentation

## 2013-12-27 DIAGNOSIS — IMO0001 Reserved for inherently not codable concepts without codable children: Secondary | ICD-10-CM | POA: Insufficient documentation

## 2013-12-27 DIAGNOSIS — F329 Major depressive disorder, single episode, unspecified: Secondary | ICD-10-CM | POA: Insufficient documentation

## 2013-12-27 DIAGNOSIS — F411 Generalized anxiety disorder: Secondary | ICD-10-CM | POA: Insufficient documentation

## 2013-12-27 DIAGNOSIS — Z1211 Encounter for screening for malignant neoplasm of colon: Secondary | ICD-10-CM | POA: Insufficient documentation

## 2013-12-27 DIAGNOSIS — K219 Gastro-esophageal reflux disease without esophagitis: Secondary | ICD-10-CM | POA: Insufficient documentation

## 2013-12-27 DIAGNOSIS — I10 Essential (primary) hypertension: Secondary | ICD-10-CM | POA: Insufficient documentation

## 2013-12-27 DIAGNOSIS — K644 Residual hemorrhoidal skin tags: Secondary | ICD-10-CM

## 2013-12-27 DIAGNOSIS — Z888 Allergy status to other drugs, medicaments and biological substances status: Secondary | ICD-10-CM | POA: Insufficient documentation

## 2013-12-27 HISTORY — PX: COLONOSCOPY: SHX5424

## 2013-12-27 SURGERY — COLONOSCOPY
Anesthesia: Moderate Sedation

## 2013-12-27 MED ORDER — SODIUM CHLORIDE 0.9 % IV SOLN
INTRAVENOUS | Status: DC
  Administered 2013-12-27: 09:00:00 via INTRAVENOUS

## 2013-12-27 MED ORDER — MIDAZOLAM HCL 5 MG/5ML IJ SOLN
INTRAMUSCULAR | Status: DC | PRN
  Administered 2013-12-27: 2 mg via INTRAVENOUS
  Administered 2013-12-27 (×2): 3 mg via INTRAVENOUS
  Administered 2013-12-27 (×2): 2 mg via INTRAVENOUS
  Administered 2013-12-27: 1 mg via INTRAVENOUS
  Administered 2013-12-27: 2 mg via INTRAVENOUS

## 2013-12-27 MED ORDER — MEPERIDINE HCL 50 MG/ML IJ SOLN
INTRAMUSCULAR | Status: AC
  Filled 2013-12-27: qty 1

## 2013-12-27 MED ORDER — MIDAZOLAM HCL 5 MG/5ML IJ SOLN
INTRAMUSCULAR | Status: AC
  Filled 2013-12-27: qty 10

## 2013-12-27 MED ORDER — SIMETHICONE 40 MG/0.6ML PO SUSP
ORAL | Status: DC | PRN
  Administered 2013-12-27: 10:00:00

## 2013-12-27 MED ORDER — MIDAZOLAM HCL 5 MG/5ML IJ SOLN
INTRAMUSCULAR | Status: AC
  Filled 2013-12-27: qty 5

## 2013-12-27 MED ORDER — MEPERIDINE HCL 50 MG/ML IJ SOLN
INTRAMUSCULAR | Status: DC | PRN
  Administered 2013-12-27 (×2): 25 mg via INTRAVENOUS

## 2013-12-27 NOTE — Progress Notes (Signed)
Colonoscopy to ascending colon, unable to reach cecum

## 2013-12-27 NOTE — Op Note (Signed)
COLONOSCOPY PROCEDURE REPORT  PATIENT:  Alexis Barnes  MR#:  [id number] Birthdate:  11/28/1945, 68 y.o., female Endoscopist:  Dr. Malissa Hippo, MD Referred By:  Dr. Lanier Clam, MD Procedure Date: 12/27/2013  Procedure:   Colonoscopy  Indications:  Patient is 68 year old Caucasian female who is undergoing screening colonoscopy. Her sister and colon cancer in her 16s. Patient's last colonoscopy was 7 years ago elsewhere.  Informed Consent:  The procedure and risks were reviewed with the patient and informed consent was obtained.  Medications:  Demerol 50 mg IV Versed 15 mg IV  Description of procedure:  After a digital rectal exam was performed, that colonoscope was advanced from the anus through the rectum and colon to the area of the ascending colon. Scope could not be advanced to cecum. She had thick liquid stool in ascending colon limiting the exam. scope was slowly and cautiously withdrawn. The mucosal surfaces were carefully surveyed utilizing scope tip to flexion to facilitate fold flattening as needed. The scope was pulled down into the rectum where a thorough exam including retroflexion was performed.  Findings:  Prep satisfactory she had thick liquid stool in ascending colon. Cecum not reached. Tortuous colon with inability to prevent loop formation. Focal scarring noted at sigmoid colon(patient had colitis in January 2015). Few diverticula at sigmoid colon. Normal rectal mucosa. Small hemorrhoids below the dentate line.   Therapeutic/Diagnostic Maneuvers Performed:  None  Complications:  None  Cecal Withdrawal Time:  NA  Impression:  Examination performed to ascending colon but cecum could not be reached. Tortuous colon resulting in loop formation which could not be reduced. Mild sigmoid colon diverticulosis. External hemorrhoids.  Recommendations:  Standard instructions given. Will schedule virtual colonoscopy later this month. Will consider next colonoscopy  with propofol as patient difficult to sedate.  Malissa Hippo  12/27/2013 11:30 AM  CC: Dr. Kirstie Peri, MD & Dr. Bonnetta Barry ref. provider found

## 2013-12-27 NOTE — Discharge Instructions (Signed)
Resume usual medications and diet. No driving for 24 hours. Will schedule a virtual colonoscopy later this month. Office will call.  Colonoscopy, Care After Refer to this sheet in the next few weeks. These instructions provide you with information on caring for yourself after your procedure. Your health care provider may also give you more specific instructions. Your treatment has been planned according to current medical practices, but problems sometimes occur. Call your health care provider if you have any problems or questions after your procedure. WHAT TO EXPECT AFTER THE PROCEDURE  After your procedure, it is typical to have the following:  A small amount of blood in your stool.  Moderate amounts of gas and mild abdominal cramping or bloating. HOME CARE INSTRUCTIONS  Do not drive, operate machinery, or sign important documents for 24 hours.  You may shower and resume your regular physical activities, but move at a slower pace for the first 24 hours.  Take frequent rest periods for the first 24 hours.  Walk around or put a warm pack on your abdomen to help reduce abdominal cramping and bloating.  Drink enough fluids to keep your urine clear or pale yellow.  You may resume your normal diet as instructed by your health care provider. Avoid heavy or fried foods that are hard to digest.  Avoid drinking alcohol for 24 hours or as instructed by your health care provider.  Only take over-the-counter or prescription medicines as directed by your health care provider.  If a tissue sample (biopsy) was taken during your procedure:  Do not take aspirin or blood thinners for 7 days, or as instructed by your health care provider.  Do not drink alcohol for 7 days, or as instructed by your health care provider.  Eat soft foods for the first 24 hours. SEEK MEDICAL CARE IF: You have persistent spotting of blood in your stool 2 3 days after the procedure. SEEK IMMEDIATE MEDICAL CARE  IF:  You have more than a small spotting of blood in your stool.  You pass large blood clots in your stool.  Your abdomen is swollen (distended).  You have nausea or vomiting.  You have a fever.  You have increasing abdominal pain that is not relieved with medicine. Document Released: 02/23/2004 Document Revised: 05/01/2013 Document Reviewed: 03/18/2013 [facility] Patient Information 2014 [place], [place].

## 2013-12-27 NOTE — H&P (Signed)
Alexis Barnes is an 68 y.o. female.   Chief Complaint: Patient is here for colonoscopy. HPI: Patient is 68 year old Caucasian female who is here for a screening colonoscopy. Last exam was 7 years ago and was reportedly normal. She denies abdominal pain change in her bowel habits rectal bleeding. Past January she was treated for upper GI at [facility]. Family history significant for colon carcinoma in her sister was in the 5s at the time of diagnosis and is feeling fine about 30 years later.  Past Medical History  Diagnosis Date  . Hypertension   . Anxiety   . Mental disorder     depression  . GERD (gastroesophageal reflux disease)     no meds  . Fibromyalgia   . Arthritis     neck spurs    Past Surgical History  Procedure Laterality Date  . Right shoulder  2011  . Tubal ligation    . Vaginal delivery  [id number]  . Robotic assisted bilateral salpingo oopherectomy Bilateral 01/30/2013    Procedure: ROBOTIC ASSISTED BILATERAL SALPINGO OOPHORECTOMY;  Surgeon: Philip Aspen, DO;  Location: [facility];  Service: Gynecology;  Laterality: Bilateral;    Family History  Problem Relation Age of Onset  . Colon cancer Sister 61   Social History:  reports that she has never smoked. She does not have any smokeless tobacco history on file. She reports that she does not drink alcohol or use illicit drugs.  Allergies:  Allergies  Allergen Reactions  . Statins Other (See Comments)    Muscle aches     Medications Prior to Admission  Medication Sig Dispense Refill  . furosemide (LASIX) 20 MG tablet Take 20 mg by mouth daily.       . hydrOXYzine (ATARAX/VISTARIL) 25 MG tablet Take 25 mg by mouth 2 (two) times daily as needed for itching.       Marland Kitchen lisinopril (PRINIVIL,ZESTRIL) 20 MG tablet Take 10 mg by mouth daily.      . pregabalin (LYRICA) 25 MG capsule Take 25 mg by mouth 2 (two) times daily.      Marland Kitchen albuterol (PROAIR HFA) 108 (90 BASE) MCG/ACT inhaler Inhale 2 puffs into the lungs every 4  (four) hours as needed for wheezing or shortness of breath.         No results found for this or any previous visit (from the past 48 hour(s)). No results found.  ROS  Blood pressure 127/58, pulse 72, temperature 98.3 F (36.8 C), temperature source Oral, resp. rate 20, height 5\' 4"  (1.626 m), weight 180 lb (81.647 kg), SpO2 99.00%. Physical Exam  Constitutional: She appears well-developed and well-nourished.  HENT:  Mouth/Throat: Oropharynx is clear and moist.  Eyes: Conjunctivae are normal. No scleral icterus.  Neck: No thyromegaly present.  Cardiovascular: Normal rate, regular rhythm and normal heart sounds.   No murmur heard. Respiratory: Effort normal and breath sounds normal.  GI: Soft. She exhibits no distension and no mass. There is no tenderness.  Laparoscopy scars lower abdomen.  Musculoskeletal: She exhibits no edema.  Lymphadenopathy:    She has no cervical adenopathy.  Neurological: She is alert.  Skin: Skin is warm and dry.     Assessment/Plan High risk screening colonoscopy.   Najeeb U Rehman 12/27/2013, 10:20 AM

## 2013-12-31 ENCOUNTER — Encounter (HOSPITAL_COMMUNITY): Payer: Self-pay | Admitting: Internal Medicine

## 2014-01-08 ENCOUNTER — Other Ambulatory Visit (INDEPENDENT_AMBULATORY_CARE_PROVIDER_SITE_OTHER): Payer: Self-pay | Admitting: Internal Medicine

## 2014-01-08 DIAGNOSIS — Q438 Other specified congenital malformations of intestine: Secondary | ICD-10-CM

## 2014-01-15 ENCOUNTER — Encounter (INDEPENDENT_AMBULATORY_CARE_PROVIDER_SITE_OTHER): Payer: Self-pay

## 2014-01-27 ENCOUNTER — Telehealth (INDEPENDENT_AMBULATORY_CARE_PROVIDER_SITE_OTHER): Payer: Self-pay | Admitting: *Deleted

## 2014-01-27 NOTE — Telephone Encounter (Signed)
Ann, please find out if patient interested in this study are not

## 2014-01-27 NOTE — Telephone Encounter (Signed)
FYIAlvino Chapel: [facility] called to let us know she has left 3 messages for patient to call her so they can get her scheduled for virtual TCS, patient hasn't returned any calls

## 2014-01-28 NOTE — Telephone Encounter (Signed)
Spoke to Ms Alexis Barnes and her husband is having some medical issues and she's been busy with him and said once she gets him settled she will call to schedule virtual TCS, Alvino Chapelllen @ [facility] is aware

## 2014-01-28 NOTE — Telephone Encounter (Signed)
Thanks for follow up.

## 2014-05-19 ENCOUNTER — Other Ambulatory Visit (INDEPENDENT_AMBULATORY_CARE_PROVIDER_SITE_OTHER): Payer: Self-pay | Admitting: Internal Medicine

## 2014-05-19 ENCOUNTER — Ambulatory Visit
Admission: RE | Admit: 2014-05-19 | Discharge: 2014-05-19 | Disposition: A | Discharge status: Home / Self Care | Payer: Medicare Other | Source: Ambulatory Visit | Attending: Internal Medicine | Admitting: Internal Medicine

## 2014-05-19 ENCOUNTER — Encounter (INDEPENDENT_AMBULATORY_CARE_PROVIDER_SITE_OTHER): Payer: Self-pay

## 2014-05-19 DIAGNOSIS — Q438 Other specified congenital malformations of intestine: Secondary | ICD-10-CM

## 2014-05-20 ENCOUNTER — Ambulatory Visit
Admission: RE | Admit: 2014-05-20 | Discharge: 2014-05-20 | Disposition: A | Discharge status: Home / Self Care | Payer: Medicare Other | Source: Ambulatory Visit | Attending: Internal Medicine | Admitting: Internal Medicine

## 2014-05-20 DIAGNOSIS — Q438 Other specified congenital malformations of intestine: Secondary | ICD-10-CM

## 2015-05-14 ENCOUNTER — Encounter: Payer: Self-pay | Admitting: Cardiology

## 2015-05-14 ENCOUNTER — Ambulatory Visit (INDEPENDENT_AMBULATORY_CARE_PROVIDER_SITE_OTHER): Payer: Medicare Other | Admitting: Cardiology

## 2015-05-14 VITALS — BP 127/80 | HR 96 | Ht 63.0 in | Wt 189.8 lb

## 2015-05-14 DIAGNOSIS — I779 Disorder of arteries and arterioles, unspecified: Secondary | ICD-10-CM

## 2015-05-14 DIAGNOSIS — I10 Essential (primary) hypertension: Secondary | ICD-10-CM

## 2015-05-14 DIAGNOSIS — Z9189 Other specified personal risk factors, not elsewhere classified: Secondary | ICD-10-CM | POA: Diagnosis not present

## 2015-05-14 DIAGNOSIS — Z87898 Personal history of other specified conditions: Secondary | ICD-10-CM

## 2015-05-14 DIAGNOSIS — I739 Peripheral vascular disease, unspecified: Secondary | ICD-10-CM

## 2015-05-14 DIAGNOSIS — R0602 Shortness of breath: Secondary | ICD-10-CM

## 2015-05-14 NOTE — Patient Instructions (Signed)
Your physician recommends that you continue on your current medications as directed. Please refer to the Current Medication list given to you today. Your physician has requested that you have an echocardiogram. Echocardiography is a painless test that uses sound waves to create images of your heart. It provides your doctor with information about the size and shape of your heart and how well your heart's chambers and valves are working. This procedure takes approximately one hour. There are no restrictions for this procedure. Your physician has requested that you have a carotid duplex. This test is an ultrasound of the carotid arteries in your neck. It looks at blood flow through these arteries that supply the brain with blood. Allow one hour for this exam. There are no restrictions or special instructions. We will call you with your results.

## 2015-05-14 NOTE — Progress Notes (Signed)
Cardiology Office Note  Date: 05/14/2015   ID: Alexis Barnes, Alexis Barnes 1945-10-07, MRN [id number]  Primary care physician: None at present Referring provider: Dr. Azucena Freed, [facility]  Consulting Cardiologist: Nona Dell, MD   Chief Complaint  Patient presents with  . Shortness of Breath    History of Present Illness: Alexis Barnes is a 69 y.o. female referred for cardiology consultation by Dr. Craig Guess.  She is here with her daughter today. I reviewed electronic records through Care Everywhere and also recent office notes from her pain clinic visits.  She presents with several concerns, mentioning to me aspects of her history over the last year and a half, hospitalization in [place] and also [facility] last year, previous syncope when on lisinopril and also complicated by dehydration, subsequent use of Lasix intermittently over the last year, weight gain on Lyrica, and also being told that she had "congestive heart failure."  She does not have a primary care provider at this time, previously saw Dr. Sherryll Burger, but she and her daughter have lost confidence in him and plan to look for a new provider.  I reviewed her medications. She does have an albuterol inhaler which she uses periodically with reported benefit for symptoms of shortness of breath and mild wheezing, but it is not clear that she is ever had any formal PFTs or documented chronic lung disease.  As noted above, she was previously on lisinopril, dose was reduced in the setting of dehydration and syncope, however at some point she came off of the medication entirely. History describes hypertension, but she has not been on a standing antihypertensive medication recently.  I reviewed to echocardiogram reports that she had last year, one at [facility], and one at [facility]. Results overall were fairly reassuring demonstrating normal LVEF and no major valvular abnormalities that would explain  symptoms. She was described on one report as having diastolic dysfunction (ungraded), and it was during her hospitalization at [facility] early last year that she was placed on as needed Lasix.  She also tells me that she has cervical bone spurs and was told based on imaging studies in the past that she had a narrowing involving her right carotid artery.  She reports a history of heart disease in her father who died in his early 71s. No obvious premature cardiovascular disease however.  She states that she has been told in the past that she has borderline diabetes, has not had regular follow-up for this recently however.  ECG today shows sinus rhythm with nonspecific T-wave changes.  Past Medical History  Diagnosis Date  . Essential hypertension   . Anxiety   . Depression   . GERD (gastroesophageal reflux disease)   . Fibromyalgia   . Arthritis   . Postherpetic neuralgia   . Hyperlipidemia   . Infectious colitis     Associated with syncope and sepsis, managed at [facility] January 2015    Past Surgical History  Procedure Laterality Date  . Rotator cuff surgery Right 2011  . Tubal ligation    . Vaginal delivery  [id number]  . Robotic assisted bilateral salpingo oopherectomy Bilateral 01/30/2013    Procedure: ROBOTIC ASSISTED BILATERAL SALPINGO OOPHORECTOMY;  Surgeon: Philip Aspen, DO;  Location: [facility];  Service: Gynecology;  Laterality: Bilateral;  . Colonoscopy N/A 12/27/2013    Procedure: COLONOSCOPY;  Surgeon: Malissa Hippo, MD;  [facility];  Service: Endoscopy;  Laterality: N/A;  955    Current Outpatient Prescriptions  Medication Sig Dispense Refill  . albuterol (PROAIR HFA) 108 (90 BASE) MCG/ACT inhaler Inhale 2 puffs into the lungs every 4 (four) hours as needed for wheezing or shortness of breath.     . B Complex Vitamins (VITAMIN B COMPLEX PO) Take 1 tablet by mouth 2 (two) times daily.    Marland Kitchen HYDROcodone-acetaminophen (NORCO) 7.5-325 MG tablet Take 1 tablet by mouth  as needed.  0  . pregabalin (LYRICA) 25 MG capsule Take 25 mg by mouth 2 (two) times daily.    . Vitamin D, Cholecalciferol, 1000 UNITS TABS Take 1 tablet by mouth 3 (three) times daily.     No current facility-administered medications for this visit.    Allergies:  Statins   Social History: The patient  reports that she has never smoked. She does not have any smokeless tobacco history on file. She reports that she does not drink alcohol or use illicit drugs.   Family History: The patient's family history includes Colon cancer (age of onset: 18) in her sister; Heart attack in her father.   ROS:  Please see the history of present illness. Otherwise, complete review of systems is positive for chronic neck pain with decreased range of motion , anxiety. She has not had any recent syncope. She has postherpetic neuralgia affecting her left thorax and arm. All other systems are reviewed and negative.   Physical Exam: VS:  BP 127/80 mmHg  Pulse 96  Ht  (1.6 m)  Wt 189 lb 12.8 oz (86.093 kg)  BMI 33.63 kg/m2  SpO2 97%, BMI Body mass index is 33.63 kg/(m^2).  Wt Readings from Last 3 Encounters:  05/14/15 189 lb 12.8 oz (86.093 kg)  12/27/13 180 lb (81.647 kg)  12/11/13 180 lb 4.8 oz (81.784 kg)     General:  Overweight woman, appears comfortable at rest. HEENT: Conjunctiva and lids normal, oropharynx clear. Neck: Supple, no elevated JVP or carotid bruits, no thyromegaly. Lungs: Clear to auscultation, nonlabored breathing at rest. Cardiac: Regular rate and rhythm, S4, no significant systolic murmur, no pericardial rub. Abdomen: Soft, nontender, bowel sounds present. Extremities: No pitting edema, distal pulses 2+. Skin: Warm and dry. Musculoskeletal: No kyphosis. Neuropsychiatric: Alert and oriented x3, somewhat anxious.   ECG: Tracing from 10/27/2013 showed normal sinus rhythm.   Recent Labwork:  April 2015: BUN 14, creatinine 0.7, potassium 3.9, hemoglobin 11.5, platelets 313.    Other Studies Reviewed Today:  1. Echocardiogram 08/19/2013 [facility]): FINDINGS:  LEFT VENTRICLE The left ventricular size is normal. There is normal left ventricular wall  thickness. Left ventricular systolic function is normal. LV ejection fraction  = 55-60%. Left ventricular filling pattern is impaired. The left ventricular  wall motion is normal. -  RIGHT VENTRICLE The right ventricle is normal size. The right ventricular systolic function  is normal.  LEFT ATRIUM The left atrial size is normal.  RIGHT ATRIUM  Right atrial size is normal. - AORTIC VALVE Structurally normal aortic valve. No aortic regurgitation is present. - MITRAL VALVE The mitral valve leaflets appear normal. There is trace mitral regurgitation. - TRICUSPID VALVE Structurally normal tricuspid valve. There is trace tricuspid regurgitation. - PULMONIC VALVE The pulmonic valve is not well visualized. - ARTERIES The aortic root is normal size. - VENOUS IVC size was normal. - EFFUSION There is no pericardial effusion. - - MMode/2D Measurements & Calculations IVSd: 1.1 cm LVIDd: 3.9 cm LVPWd: 0.88 cm LVIDs: 2.5 cm LA dim: 3.7 cm Ao root: 2.7 cm EDV(MOD-sp4): 55.9 ml ESV(MOD-sp4): 25.9  ml LVOT diam: 1.5 cm LA area A2: 13.0 cm2 LA area A4: 13.3 cm2 LA length (vol): 4.9 cm LA vol: 30.3 ml LA vol index: 15.5 ml/m2 RA area A4: 7.9 cm2 Doppler Measurements & Calculations MV E max vel: 93.6 cm/sec MV A max vel: 116.9 cm/sec MV E/A: 0.80 Med Peak E' Vel: 12.1 cm/sec Lat Peak E' Vel: 7.7 cm/sec E/Lat E`: 12.1 E/Med E`: 7.8 MV dec time: 0.20 sec SV(LVOT): 51.8 ml Ao max PG: 9.4 mmHg Ao mean PG: 4.8 mmHg Ao V2 VTI: 26.0 cm AVA (VTI): 2.0 cm2 LV V1 VTI: 27.7 cm  2. Echocardiogram 10/28/2013 [facility] reported normal LV wall thickness and chamber size, LVEF 60-65%, mildly sclerotic aortic valve, mildly thickened mitral leaflets with trace mitral regurgitation, RVSP less than 35 mmHg,  no pericardial effusion.   Assessment and Plan:  1. Reported history of diastolic dysfunction based on echocardiogram from last year.  She was placed on Lasix following a hospitalization at [facility] in January 2015.  She is not on diuretic at this time. History includes hypertension, although blood pressure trend over time is not well understood, and she is not on any antihypertensives currently.  She does not have any leg edema now, reports having general weight gain over several months and attributes this to Lyrica which she does not take regularly.  She reports dyspnea on exertion and a feeling that her heart rate increases unusually when she walks, has had no syncope since episodes earlier last year complicated by dehydration and infectious colitis.  I discussed with her the results of her previous evaluation based on record review. ECG today is overall nonspecific. I have recommended an echocardiogram to reassess cardiac structure and function, we will call her with the results.  2. No regular health maintenance. After discussing her symptoms and concerns as well as reviewing prior history, by main recommendation to her today was to establish with a primary care provider who could do a complete physical with lab work and then direct her care to specialists as needed.  3. 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 I did not appreciate a bruit on examination. We will obtain carotid Dopplers for objective evaluation.  4. Previous history of syncope complicated by dehydration. She may have an exaggerated  vasovagal response to contributes as well.   Current medicines were reviewed with the patient today.   Orders Placed This Encounter  Procedures  . EKG 12-Lead  . ECHOCARDIOGRAM COMPLETE    Disposition: Call with results.  Signed, Jonelle SidleSamuel G. Jakia Kennebrew, MD, [facility] 05/14/2015 2:07 PM    [facility] [address],  [place], [place] [address] Phone: [phone]) [phone]; Fax: [phone]) [phone]

## 2015-06-04 ENCOUNTER — Ambulatory Visit (INDEPENDENT_AMBULATORY_CARE_PROVIDER_SITE_OTHER): Payer: Medicare Other

## 2015-06-04 ENCOUNTER — Other Ambulatory Visit: Payer: Self-pay

## 2015-06-04 DIAGNOSIS — R0602 Shortness of breath: Secondary | ICD-10-CM | POA: Diagnosis not present

## 2015-06-04 DIAGNOSIS — I779 Disorder of arteries and arterioles, unspecified: Secondary | ICD-10-CM | POA: Diagnosis not present

## 2015-06-04 DIAGNOSIS — I739 Peripheral vascular disease, unspecified: Principal | ICD-10-CM

## 2015-06-08 ENCOUNTER — Telehealth: Payer: Self-pay | Admitting: *Deleted

## 2015-06-08 NOTE — Telephone Encounter (Signed)
-----   Message from Jonelle SidleSamuel G McDowell, MD sent at 06/05/2015  9:04 AM EST ----- Reviewed report. Overall limited images, but LVEF is in normal range at 60-65%, similarly documented on prior studies. There is evidence of mild, grade 1 diastolic dysfunction which is also similar to prior assessment. Use of a diuretic intermittently could be considered, even a standing diuretic would be a reasonable choice in the setting of hypertension. As we discussed in her office encounter, she needs to establish with a new PCP which was her plan. No additional cardiac testing is anticipated at this time.

## 2015-06-08 NOTE — Telephone Encounter (Signed)
-----   Message from Jonelle SidleSamuel G McDowell, MD sent at 06/05/2015  9:01 AM EST ----- Reviewed report. Reassuring, please let her know that there was evidence of only 1-39% bilateral ICA stenosis. This should not be associated with any symptoms, and does not require further evaluation at this particular time. Would suggest taking a baby aspirin daily and work on management of her lipid status with primary care provider. A follow-up carotid Doppler study could be obtained in the next 2 years for reassessment.

## 2015-06-11 NOTE — Telephone Encounter (Signed)
Patient informed and verbalized understanding of plan. 

## 2016-04-28 ENCOUNTER — Telehealth: Payer: Self-pay | Admitting: Cardiology

## 2016-04-28 NOTE — Telephone Encounter (Signed)
PATIENT would like a call about her test that were done in 2016

## 2016-04-28 NOTE — Telephone Encounter (Signed)
Left message to return call 

## 2016-05-04 NOTE — Telephone Encounter (Signed)
Patient stated that she was going through some papers from last year and found something from us stating she had carotid artery disease.    ----- Message from Jonelle SidleSamuel G McDowell, MD sent at 06/05/2015  9:01 AM EST ----- Reviewed report. Reassuring, please let her know that there was evidence of only 1-39% bilateral ICA stenosis. This should not be associated with any symptoms, and does not require further evaluation at this particular time. Would suggest taking a baby aspirin daily and work on management of her lipid status with primary care provider. A follow-up carotid Doppler study could be obtained in the next 2 years for reassessment.  Reviewed above with patient regarding Dr. Ival BibleMcDowell's findings on last carotid doppler.  Patient verbalized undersanding.

## 2016-07-14 ENCOUNTER — Encounter (HOSPITAL_COMMUNITY): Payer: Self-pay | Admitting: Emergency Medicine

## 2016-07-14 ENCOUNTER — Inpatient Hospital Stay (HOSPITAL_COMMUNITY)
Admission: EM | Admit: 2016-07-14 | Discharge: 2016-07-16 | DRG: 149 | Disposition: A | Discharge status: Home / Self Care | Payer: Medicare Other | Attending: Family Medicine | Admitting: Family Medicine

## 2016-07-14 ENCOUNTER — Emergency Department (HOSPITAL_COMMUNITY): Payer: Medicare Other

## 2016-07-14 DIAGNOSIS — K529 Noninfective gastroenteritis and colitis, unspecified: Secondary | ICD-10-CM | POA: Diagnosis not present

## 2016-07-14 DIAGNOSIS — Z888 Allergy status to other drugs, medicaments and biological substances status: Secondary | ICD-10-CM

## 2016-07-14 DIAGNOSIS — R197 Diarrhea, unspecified: Secondary | ICD-10-CM

## 2016-07-14 DIAGNOSIS — Z8673 Personal history of transient ischemic attack (TIA), and cerebral infarction without residual deficits: Secondary | ICD-10-CM

## 2016-07-14 DIAGNOSIS — F329 Major depressive disorder, single episode, unspecified: Secondary | ICD-10-CM | POA: Diagnosis present

## 2016-07-14 DIAGNOSIS — R2681 Unsteadiness on feet: Secondary | ICD-10-CM

## 2016-07-14 DIAGNOSIS — M797 Fibromyalgia: Secondary | ICD-10-CM | POA: Diagnosis present

## 2016-07-14 DIAGNOSIS — M1990 Unspecified osteoarthritis, unspecified site: Secondary | ICD-10-CM | POA: Diagnosis present

## 2016-07-14 DIAGNOSIS — H8110 Benign paroxysmal vertigo, unspecified ear: Secondary | ICD-10-CM | POA: Diagnosis not present

## 2016-07-14 DIAGNOSIS — R42 Dizziness and giddiness: Secondary | ICD-10-CM

## 2016-07-14 DIAGNOSIS — I639 Cerebral infarction, unspecified: Secondary | ICD-10-CM

## 2016-07-14 DIAGNOSIS — R195 Other fecal abnormalities: Secondary | ICD-10-CM | POA: Diagnosis present

## 2016-07-14 DIAGNOSIS — Z8249 Family history of ischemic heart disease and other diseases of the circulatory system: Secondary | ICD-10-CM

## 2016-07-14 DIAGNOSIS — K219 Gastro-esophageal reflux disease without esophagitis: Secondary | ICD-10-CM | POA: Diagnosis present

## 2016-07-14 DIAGNOSIS — E785 Hyperlipidemia, unspecified: Secondary | ICD-10-CM | POA: Diagnosis present

## 2016-07-14 DIAGNOSIS — Z800 Family history of malignant neoplasm of digestive organs: Secondary | ICD-10-CM

## 2016-07-14 DIAGNOSIS — F419 Anxiety disorder, unspecified: Secondary | ICD-10-CM | POA: Diagnosis present

## 2016-07-14 DIAGNOSIS — I10 Essential (primary) hypertension: Secondary | ICD-10-CM | POA: Diagnosis present

## 2016-07-14 DIAGNOSIS — Z7982 Long term (current) use of aspirin: Secondary | ICD-10-CM

## 2016-07-14 DIAGNOSIS — B0229 Other postherpetic nervous system involvement: Secondary | ICD-10-CM | POA: Diagnosis present

## 2016-07-14 HISTORY — DX: Dizziness and giddiness: R42

## 2016-07-14 LAB — URINALYSIS, ROUTINE W REFLEX MICROSCOPIC
Bacteria, UA: NONE SEEN
Bilirubin Urine: NEGATIVE
GLUCOSE, UA: NEGATIVE mg/dL
HGB URINE DIPSTICK: NEGATIVE
KETONES UR: NEGATIVE mg/dL
NITRITE: NEGATIVE
PH: 6 (ref 5.0–8.0)
PROTEIN: NEGATIVE mg/dL
Specific Gravity, Urine: 1.011 (ref 1.005–1.030)

## 2016-07-14 LAB — CBC WITH DIFFERENTIAL/PLATELET
BASOS ABS: 0 10*3/uL (ref 0.0–0.1)
Basophils Relative: 0 %
EOS ABS: 0.4 10*3/uL (ref 0.0–0.7)
Eosinophils Relative: 4 %
HEMATOCRIT: 42.1 % (ref 36.0–46.0)
HEMOGLOBIN: 14.2 g/dL (ref 12.0–15.0)
Lymphocytes Relative: 34 %
Lymphs Abs: 4 10*3/uL (ref 0.7–4.0)
MCH: 33.2 pg (ref 26.0–34.0)
MCHC: 33.7 g/dL (ref 30.0–36.0)
MCV: 98.4 fL (ref 78.0–100.0)
Monocytes Absolute: 1.1 10*3/uL — ABNORMAL HIGH (ref 0.1–1.0)
Monocytes Relative: 9 %
NEUTROS ABS: 6.3 10*3/uL (ref 1.7–7.7)
Neutrophils Relative %: 53 %
Platelets: 417 10*3/uL — ABNORMAL HIGH (ref 150–400)
RBC: 4.28 MIL/uL (ref 3.87–5.11)
RDW: 12.2 % (ref 11.5–15.5)
WBC: 11.8 10*3/uL — ABNORMAL HIGH (ref 4.0–10.5)

## 2016-07-14 LAB — COMPREHENSIVE METABOLIC PANEL
ALK PHOS: 48 U/L (ref 38–126)
ALT: 19 U/L (ref 14–54)
AST: 21 U/L (ref 15–41)
Albumin: 3.9 g/dL (ref 3.5–5.0)
Anion gap: 8 (ref 5–15)
BUN: 16 mg/dL (ref 6–20)
CO2: 24 mmol/L (ref 22–32)
Calcium: 8.8 mg/dL — ABNORMAL LOW (ref 8.9–10.3)
Chloride: 105 mmol/L (ref 101–111)
Creatinine, Ser: 0.9 mg/dL (ref 0.44–1.00)
GFR calc Af Amer: >60 mL/min (ref >60)
GFR calc non Af Amer: >60 mL/min (ref >60)
GLUCOSE: 101 mg/dL — AB (ref 65–99)
POTASSIUM: 3.7 mmol/L (ref 3.5–5.1)
Sodium: 137 mmol/L (ref 135–145)
TOTAL PROTEIN: 7.2 g/dL (ref 6.5–8.1)
Total Bilirubin: 0.4 mg/dL (ref 0.3–1.2)

## 2016-07-14 LAB — LACTIC ACID, PLASMA: Lactic Acid, Venous: 1.3 mmol/L (ref 0.5–1.9)

## 2016-07-14 LAB — TROPONIN I: Troponin I: <0.03 ng/mL (ref <0.03)

## 2016-07-14 LAB — LIPASE, BLOOD: Lipase: 13 U/L (ref 11–51)

## 2016-07-14 LAB — POC OCCULT BLOOD, ED: Fecal Occult Bld: POSITIVE — AB

## 2016-07-14 MED ORDER — IOPAMIDOL (ISOVUE-300) INJECTION 61%
INTRAVENOUS | Status: AC
  Filled 2016-07-14: qty 30

## 2016-07-14 MED ORDER — PANTOPRAZOLE SODIUM 40 MG PO TBEC: 40.0000 mg | DELAYED_RELEASE_TABLET | Freq: Every day | ORAL | Status: DC

## 2016-07-14 MED ORDER — CIPROFLOXACIN IN D5W 400 MG/200ML IV SOLN
400.0000 mg | Freq: Once | INTRAVENOUS | Status: AC
  Administered 2016-07-14: 400 mg via INTRAVENOUS
  Filled 2016-07-14: qty 200

## 2016-07-14 MED ORDER — ONDANSETRON 8 MG PO TBDP
8.0000 mg | ORAL_TABLET | Freq: Once | ORAL | Status: AC
  Administered 2016-07-14: 8 mg via ORAL
  Filled 2016-07-14: qty 1

## 2016-07-14 MED ORDER — IOPAMIDOL (ISOVUE-300) INJECTION 61%
100.0000 mL | Freq: Once | INTRAVENOUS | Status: AC | PRN
  Administered 2016-07-14: 100 mL via INTRAVENOUS

## 2016-07-14 MED ORDER — SODIUM CHLORIDE 0.9 % IV BOLUS (SEPSIS)
500.0000 mL | Freq: Once | INTRAVENOUS | Status: AC
Start: 2016-07-14 — End: 2016-07-14
  Administered 2016-07-14: 500 mL via INTRAVENOUS

## 2016-07-14 MED ORDER — MECLIZINE HCL 12.5 MG PO TABS
25.0000 mg | ORAL_TABLET | Freq: Once | ORAL | Status: AC
  Administered 2016-07-14: 25 mg via ORAL
  Filled 2016-07-14: qty 2

## 2016-07-14 MED ORDER — PANTOPRAZOLE SODIUM 40 MG PO TBEC
40.0000 mg | DELAYED_RELEASE_TABLET | Freq: Two times a day (BID) | ORAL | Status: DC
  Administered 2016-07-15 – 2016-07-16 (×4): 40 mg via ORAL
  Filled 2016-07-14 (×4): qty 1

## 2016-07-14 MED ORDER — METRONIDAZOLE IN NACL 5-0.79 MG/ML-% IV SOLN
500.0000 mg | Freq: Once | INTRAVENOUS | Status: AC
  Administered 2016-07-14: 500 mg via INTRAVENOUS
  Filled 2016-07-14: qty 100

## 2016-07-14 MED ORDER — DIAZEPAM 2 MG PO TABS
2.0000 mg | ORAL_TABLET | Freq: Once | ORAL | Status: AC
  Administered 2016-07-14: 2 mg via ORAL
  Filled 2016-07-14: qty 1

## 2016-07-14 MED ORDER — SODIUM CHLORIDE 0.9 % IV SOLN
INTRAVENOUS | Status: DC
  Administered 2016-07-14: 1000 mL via INTRAVENOUS

## 2016-07-14 NOTE — H&P (Signed)
History and Physical    Alexis Barnes ZOX:[id number] DOB: 1945-11-15 DOA: 07/14/2016  PCP: Valla Leaver, MD Consultants:  Karilyn Cota, GI (last C-scope 2015) Patient coming from: home - lives with husband; NOK: daughter, [phone]  Chief Complaint: vertigo  HPI: Alexis Barnes is a 70 y.o. 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  She reports that she hasn't felt good all week, kind of weak.  This morning when she tried to get up, everything started spinning, wasn't able to get up.  Was finally able to make it to the bathroom.  Again, about 2pm, more spinning.  Would last a few minutes and resolve.  Happened again at 3:45.  Has h/o vertigo.  Also with h/o diverticulitis.  Hospitalized at [facility] for 15 days in 1/15 for this.  Started having diarrhea last night - before starting the dizzy spells.  Abdominal pain, diarrhea, last stool was about 0100.  Maybe 5 total episodes of diarrhea.  Slight further abdominal pain.  Ongoing indigestion, has been belching - chronically.  No fever.  Able to eat/drink today.  Stools were dark in color.    Swelling in glands under left arm.  Had shingles in 2014.  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  This has been ongoing since then and she has seen multiple physicians about it.  She has been unable to have mammograms since due to persistent neuralgia.   ED Course: Per Dr. Clarene Duke: 2240: Pt given antivert for vertigo. Pt ambulated with continued vertigo symptoms; will dose valium. No vomiting in ED. IV abx started for colitis. Concerned regarding d/c due to continued vertigo symptoms, diarrhea, and pt needing to care for elderly husband with dementia. Dx and testing d/w pt and family. Questions answered. Verb understanding, agreeable to observation admit. T/C to [facility]. Ophelia Charter, case  discussed, including: HPI, pertinent PM/SHx, VS/PE, dx testing, ED course and treatment: Agreeable to admit, requests to write temporary orders, obtain observation medical bed to team [facility].   Review of Systems: As per HPI; otherwise 10 point review of systems reviewed and negative.   Ambulatory Status:  Usually ambualtes withgout difficulty, today afraid of falling due to vertigo  Past Medical History:  Diagnosis Date  . Anxiety   . Arthritis   . Depression   . Essential hypertension   . Fibromyalgia   . GERD (gastroesophageal reflux disease)   . Hyperlipidemia   . Infectious colitis    Associated with syncope and sepsis, managed at [facility] January 2015  . Postherpetic neuralgia   . Vertigo     Past Surgical History:  Procedure Laterality Date  . COLONOSCOPY N/A 12/27/2013   Procedure: COLONOSCOPY;  Surgeon: Malissa Hippo, MD;  Location: [facility];  Service: Endoscopy;  Laterality: N/A;  955  . ROBOTIC ASSISTED BILATERAL SALPINGO OOPHERECTOMY Bilateral 01/30/2013   Procedure: ROBOTIC ASSISTED BILATERAL SALPINGO OOPHORECTOMY;  Surgeon: Philip Aspen, DO;  Location: [facility] ORS;  Service: Gynecology;  Laterality: Bilateral;  . Rotator cuff surgery Right 2011  . TUBAL LIGATION    . VAGINAL DELIVERY  [id number],[id number]    Social History   Social History  . Marital status: Married    Spouse name: N/A  . Number of children: N/A  . Years of education: N/A   Occupational History  . Not on file.   Social History Main Topics  . Smoking status: Never Smoker  .  Smokeless tobacco: Never Used  . Alcohol use No  . Drug use: No  . Sexual activity: Not on file   Other Topics Concern  . Not on file   Social History Narrative  . No narrative on file    Allergies  Allergen Reactions  . Statins Other (See Comments)    Muscle aches     Family History  Problem Relation Age of Onset  . Heart attack Father     Age 72  . Colon cancer Sister [id number]    Prior to Admission medications     Medication Sig Start Date End Date Taking? Authorizing Provider  Ascorbic Acid (VITAMIN C) 100 MG tablet Take 100 mg by mouth daily.   Yes Historical Provider, MD  aspirin 81 MG chewable tablet Chew 81 mg by mouth daily.   Yes Historical Provider, MD  B Complex Vitamins (VITAMIN B COMPLEX PO) Take 1 tablet by mouth 2 (two) times daily.   Yes Historical Provider, MD  ezetimibe (ZETIA) 10 MG tablet Take 10 mg by mouth daily.   Yes Historical Provider, MD  pregabalin (LYRICA) 25 MG capsule Take 25 mg by mouth 2 (two) times daily.   Yes Historical Provider, MD  Vitamin D, Cholecalciferol, 1000 UNITS TABS Take 1 tablet by mouth 3 (three) times daily.   Yes Historical Provider, MD    Physical Exam: Vitals:   07/14/16 2000 07/14/16 2019 07/14/16 2207 07/14/16 2211  BP: 113/91 121/60 (!) 124/54   Pulse: 90 91  90  Resp:  18    Temp:      TempSrc:      SpO2: 96% 97%  97%  Weight:      Height:         General:  Appears calm and comfortable and is NAD Eyes:  PERRL, EOMI, normal lids, iris ENT:  grossly normal hearing, lips & tongue, mmm Neck:  no LAD, masses or thyromegaly Cardiovascular:  RRR, no m/r/g. No LE edema.  Respiratory:  CTA bilaterally, no w/r/r. Normal respiratory effort. Abdomen:  soft, nd, NABS - reports mild bloating and feeling of fullness with palpation Skin:  no rash or induration seen on limited exam Musculoskeletal:  grossly normal tone BUE/BLE, good ROM, no bony abnormality; slight fullness in the left axilla with neuropathic pain with palpation but no significant visible anomalies Psychiatric:  grossly normal mood and affect, speech fluent and appropriate, AOx3 Neurologic:  CN 2-12 grossly intact, moves all extremities in coordinated fashion, sensation intact  Labs on Admission: I have personally reviewed following labs and imaging studies  CBC:  Recent Labs Lab 07/14/16 1946  WBC 11.8*  NEUTROABS 6.3  HGB 14.2  HCT 42.1  MCV 98.4  PLT 417*   Basic  Metabolic Panel:  Recent Labs Lab 07/14/16 1946  NA 137  K 3.7  CL 105  CO2 24  GLUCOSE 101*  BUN 16  CREATININE 0.90  CALCIUM 8.8*   GFR: Estimated Creatinine Clearance: 58 mL/min (by C-G formula based on SCr of 0.9 mg/dL). Liver Function Tests:  Recent Labs Lab 07/14/16 1946  AST 21  ALT 19  ALKPHOS 48  BILITOT 0.4  PROT 7.2  ALBUMIN 3.9    Recent Labs Lab 07/14/16 1947  LIPASE 13   No results for input(s): AMMONIA in the last 168 hours. Coagulation Profile: No results for input(s): INR, PROTIME in the last 168 hours. Cardiac Enzymes:  Recent Labs Lab 07/14/16 1947  TROPONINI <0.03   BNP (last 3 results)  No results for input(s): PROBNP in the last 8760 hours. HbA1C: No results for input(s): HGBA1C in the last 72 hours. CBG: No results for input(s): GLUCAP in the last 168 hours. Lipid Profile: No results for input(s): CHOL, HDL, LDLCALC, TRIG, CHOLHDL, LDLDIRECT in the last 72 hours. Thyroid Function Tests: No results for input(s): TSH, T4TOTAL, FREET4, T3FREE, THYROIDAB in the last 72 hours. Anemia Panel: No results for input(s): VITAMINB12, FOLATE, FERRITIN, TIBC, IRON, RETICCTPCT in the last 72 hours. Urine analysis:    Component Value Date/Time   COLORURINE YELLOW 07/14/2016 1818   APPEARANCEUR HAZY (A) 07/14/2016 1818   LABSPEC 1.011 07/14/2016 1818   PHURINE 6.0 07/14/2016 1818   GLUCOSEU NEGATIVE 07/14/2016 1818   HGBUR NEGATIVE 07/14/2016 1818   BILIRUBINUR NEGATIVE 07/14/2016 1818   KETONESUR NEGATIVE 07/14/2016 1818   PROTEINUR NEGATIVE 07/14/2016 1818   NITRITE NEGATIVE 07/14/2016 1818   LEUKOCYTESUR TRACE (A) 07/14/2016 1818    Creatinine Clearance: Estimated Creatinine Clearance: 58 mL/min (by C-G formula based on SCr of 0.9 mg/dL).  Sepsis Labs: @LABRCNTIP (procalcitonin:4,lacticidven:4) )No results found for this or any previous visit (from the past 240 hour(s)).   Radiological Exams on Admission: Ct Head Wo  Contrast  Result Date: 07/14/2016 CLINICAL DATA:  Initial evaluation for acute dizziness, standing. EXAM: CT HEAD WITHOUT CONTRAST TECHNIQUE: Contiguous axial images were obtained from the base of the skull through the vertex without intravenous contrast. COMPARISON:  None available. FINDINGS: Brain: Cerebral volume within normal limits for age. The superimposed mild chronic microvascular ischemic changes present. No evidence for acute intracranial hemorrhage. No evidence for acute large vessel territory infarct. No mass lesion, midline shift or mass effect. No hydrocephalus. No extra-axial fluid collection. Vascular: No hyperdense vessel. Skull: Scalp soft tissues within normal limits.  Calvarium intact. Sinuses/Orbits: Globes and orbital soft tissues within normal limits. Paranasal sinuses are clear. No mastoid effusion. IMPRESSION: 1. No acute intracranial process identified. 2. Mild chronic small vessel ischemic disease. Electronically Signed   By: Rise Mu M.D.   On: 07/14/2016 22:11   Ct Abdomen Pelvis W Contrast  Result Date: 07/14/2016 CLINICAL DATA:  Initial evaluation for acute diarrhea, black stools. History of diverticulitis. EXAM: CT ABDOMEN AND PELVIS WITH CONTRAST TECHNIQUE: Multidetector CT imaging of the abdomen and pelvis was performed using the standard protocol following bolus administration of intravenous contrast. CONTRAST:  ISOVUE-300 IOPAMIDOL (ISOVUE-300) INJECTION 61% COMPARISON:  None. FINDINGS: Lower chest: Mild subsegmental atelectasis seen dependently within the right lung base. Visualized lungs are otherwise clear. Hepatobiliary: Diffuse hypoattenuation of the liver compatible steatosis. Liver otherwise unremarkable. Gallbladder within normal limits. No biliary dilatation. Pancreas: Fatty atrophy of the pancreas noted. Pancreas otherwise unremarkable without acute inflammatory changes. Spleen: Spleen within normal limits. Adrenals/Urinary Tract: Adrenal glands  are normal. Kidneys equal in size with symmetric enhancement. Subcentimeter hypodensities within the kidneys bilaterally too small characterize, but statistically likely reflects small cyst. No nephrolithiasis, hydronephrosis, or focal enhancing renal mass. Ureters of normal caliber without acute abnormality. Bladder within normal limits. Stomach/Bowel: Small hiatal hernia noted. Stomach otherwise unremarkable. No evidence for bowel obstruction. No acute inflammatory changes seen about the small bowel. Appendix within normal limits. Ascending and transverse colon within normal limits. The descending and sigmoid colon are diffusely decompressed. Mild circumferential wall thickening with subtle hazy stranding suspicious for possible acute colitis given the patient's symptoms. No evidence for associated obstruction or other complication. Vascular/Lymphatic: Normal intravascular enhancement seen throughout the intra-abdominal aorta and its branch vessels. Mild to moderate aorto bi-iliac  atherosclerotic disease. No aneurysm. No adenopathy. Reproductive: Uterus within normal limits. Ovaries appear to be absent. Other: No free air or fluid. Musculoskeletal: No acute osseous abnormality. No worrisome lytic or blastic osseous lesions. IMPRESSION: 1. Mild circumferential wall thickening with hazy fat stranding about the descending and proximal sigmoid colon, suspicious for acute colitis given the patient's symptoms. No evidence for obstruction or other complication identified. 2. No other acute intra-abdominal or pelvic process. 3. Hepatic steatosis. Electronically Signed   By: Rise MuBenjamin  McClintock M.D.   On: 07/14/2016 22:19    EKG: Independently reviewed.  NSR with rate 94;  no evidence of acute ischemia  Assessment/Plan Principal Problem:   Vertigo Active Problems:   GERD (gastroesophageal reflux disease)   Colitis   Heme positive stool   Postherpetic neuralgia   Vertigo -Patient with several problems today,  but the one that seems most acute and bothersome to the patient is actually the vertigo -Due to her unsteady gait, her daughter is concerned about her safety at home tonight. -Has received Meclizine and is going to be given Valium in the ER -Will observe overnight, anticipate discharge tomorrow  Colitis with heme positive stool -Patient with h/o colitis, likely diverticulitis -CT findings support this diagnosis currently -Patient did have episodes of diarrhea with dark bloody stools yesterday but has not had any in almost 24 hours -She is able to tolerate PO intake without difficulty -No fevers or other indication of sepsis physiology -As such, outpatient treatment with PO Cipro/Flagyl would be reasonable -Based on the vertigo and the diverticulitis together, the patient/family would feel more comfortable being observed overnight -Given IV antibiotics in the ER but will transition to PO in the AM -Clear liquids, advance diet as tolerated -Morphine, Zofran prn  GERD -Patient does report bloating, belching -With the dark stools, an upper GI source is also a consideration -She has not had any emesis -Will add PPI BID for now  -Hgb is 14.2, will follow -If any further concern for possible upper GI bleeding source, would then consider GI consult  Postherpetic neuralgia -This problem is obviously bothersome to the patient but has been ongoing for several years now -She understands that we are unable to address this issue as an inpatient -Instead, she is encouraged to follow up with her PCP and to also get a mammogram as an outpatient   DVT prophylaxis:  Lovenox  Code Status: Full - confirmed with patient/family Family Communication: Daughter present throughout evaluation Disposition Plan:  Home once clinically improved Consults called: None  Admission status: It is my clinical opinion that referral for OBSERVATION is reasonable and necessary in this patient based on the above information  provided. The aforementioned taken together are felt to place the patient at high risk for further clinical deterioration. However it is anticipated that the patient may be medically stable for discharge from the hospital within 24 to 48 hours.     Jonah BlueJennifer Kadeisha Betsch MD [facility]  If 7PM-7AM, please contact night-coverage [email or web address] Password [email or web address]  07/14/2016, 11:28 PM

## 2016-07-14 NOTE — ED Triage Notes (Signed)
Pt reports onset of dizziness this am. LKW was 0100 this am. Pt states when she got up at 0100 she had diarrhea and felt like she was going to lose consciousness. Family states pt has hx of diverticulitis and had similar symptoms with last flare. Pt states she feels like the room is spinning. Intermittent episodes of the dizziness today. Pt c/o ear pain.

## 2016-07-14 NOTE — ED Provider Notes (Signed)
[facility]PT Provider Note   CSN: [id number] Arrival date & time: 07/14/16  1711     History   Chief Complaint Chief Complaint  Patient presents with  . Dizziness  . Diarrhea  . Melena    HPI Alexis Barnes is a 70 y.o. female.  HPI  Pt was seen at 1930. Per pt, c/o sudden onset and persistence of multiple intermittent episodes of "dizziness" that began overnight last night. Pt describes the dizziness as "everything is spinning around." Has been associated with nausea and right ear "ache." Pt states she also has had "black stools" and diarrhea for the past few days. Pt describes these symptoms as "when I had diverticulitis." Denies vomiting, no fevers, no rash, no CP/palpitations, no SOB/cough, no headache, no visual changes, no focal motor weakness, no tingling/numbness in extremities, no ataxia, no slurred speech, no facial droop.    Past Medical History:  Diagnosis Date  . Anxiety   . Arthritis   . Depression   . Essential hypertension   . Fibromyalgia   . GERD (gastroesophageal reflux disease)   . Hyperlipidemia   . Infectious colitis    Associated with syncope and sepsis, managed at [facility] January 2015  . Postherpetic neuralgia     Patient Active Problem List   Diagnosis Date Noted  . Family hx of colon cancer 12/11/2013  . Essential hypertension, benign 12/11/2013  . Fibromyalgia 12/11/2013  . GERD (gastroesophageal reflux disease) 12/11/2013  . Depression 12/11/2013    Past Surgical History:  Procedure Laterality Date  . COLONOSCOPY N/A 12/27/2013   Procedure: COLONOSCOPY;  Surgeon: Malissa Hippo, MD;  Location: [facility];  Service: Endoscopy;  Laterality: N/A;  955  . ROBOTIC ASSISTED BILATERAL SALPINGO OOPHERECTOMY Bilateral 01/30/2013   Procedure: ROBOTIC ASSISTED BILATERAL SALPINGO OOPHORECTOMY;  Surgeon: Philip Aspen, DO;  Location: [facility] ORS;  Service: Gynecology;  Laterality: Bilateral;  . Rotator cuff surgery Right 2011  . TUBAL  LIGATION    . VAGINAL DELIVERY  [id number]    OB History    Gravida Para Term Preterm AB Living   2 2 2          SAB TAB Ectopic Multiple Live Births                   Home Medications    Prior to Admission medications   Medication Sig Start Date End Date Taking? Authorizing Provider  albuterol (PROAIR HFA) 108 (90 BASE) MCG/ACT inhaler Inhale 2 puffs into the lungs every 4 (four) hours as needed for wheezing or shortness of breath.  08/21/13   Historical Provider, MD  aspirin 81 MG chewable tablet Chew 81 mg by mouth daily.    Historical Provider, MD  B Complex Vitamins (VITAMIN B COMPLEX PO) Take 1 tablet by mouth 2 (two) times daily.    Historical Provider, MD  HYDROcodone-acetaminophen (NORCO) 7.5-325 MG tablet Take 1 tablet by mouth as needed. 02/07/15   Historical Provider, MD  pregabalin (LYRICA) 25 MG capsule Take 25 mg by mouth 2 (two) times daily.    Historical Provider, MD  Vitamin D, Cholecalciferol, 1000 UNITS TABS Take 1 tablet by mouth 3 (three) times daily.    Historical Provider, MD    Family History Family History  Problem Relation Age of Onset  . Heart attack Father     Age 50  . Colon cancer Sister 8    Social History Social History  Substance Use Topics  . Smoking status: Never Smoker  .  Smokeless tobacco: Never Used  . Alcohol use No     Allergies   Statins   Review of Systems Review of Systems ROS: Statement: All systems negative except as marked or noted in the HPI; Constitutional: Negative for fever and chills. ; ; Eyes: Negative for eye pain, redness and discharge. ; ; ENMT: +right ear pain. Negative for hoarseness, nasal congestion, sinus pressure and sore throat. ; ; Cardiovascular: Negative for chest pain, palpitations, diaphoresis, dyspnea and peripheral edema. ; ; Respiratory: Negative for cough, wheezing and stridor. ; ; Gastrointestinal: +"black stools," diarrhea, nausea. Negative for vomiting, abdominal pain, blood in stool, hematemesis,  jaundice and rectal bleeding. . ; ; Genitourinary: Negative for dysuria, flank pain and hematuria. ; ; Musculoskeletal: Negative for back pain and neck pain. Negative for swelling and trauma.; ; Skin: Negative for pruritus, rash, abrasions, blisters, bruising and skin lesion.; ; Neuro: +"spinning." Negative for headache, lightheadedness and neck stiffness. Negative for weakness, altered level of consciousness, altered mental status, extremity weakness, paresthesias, involuntary movement, seizure and syncope.      Physical Exam Updated Vital Signs BP 113/91   Pulse 90   Temp 98.7 F (37.1 C) (Oral)   Resp 17   Ht 5\' 3"  (1.6 m)   Wt 185 lb (83.9 kg)   SpO2 96%   BMI 32.77 kg/m   20:22 Orthostatic Vital Signs AH  Orthostatic Lying   BP- Lying: 121/60  Pulse- Lying: 92      Orthostatic Sitting  BP- Sitting: 130/73  Pulse- Sitting: 101      Orthostatic Standing at 0 minutes  BP- Standing at 0 minutes: 123/76  Pulse- Standing at 0 minutes: 102     Physical Exam 1935: Physical examination:  Nursing notes reviewed; Vital signs and O2 SAT reviewed;  Constitutional: Well developed, Well nourished, Well hydrated, In no acute distress; Head:  Normocephalic, atraumatic; Eyes: EOMI, PERRL, No scleral icterus; ENMT: Clear fluid behind TM's bilat, no erythema, no purulence, no bulging. Canals clear bilat. +edemetous nasal turbinates bilat with clear rhinorrhea. Mouth and pharynx normal, Mucous membranes moist; Neck: Supple, Full range of motion, No lymphadenopathy; Cardiovascular: Regular rate and rhythm, No gallop; Respiratory: Breath sounds clear & equal bilaterally, No wheezes.  Speaking full sentences with ease, Normal respiratory effort/excursion; Chest: Nontender, Movement normal; Abdomen: Soft, +mild tenderness to palp. No rebound or guarding. Nondistended, Normal bowel sounds; Genitourinary: No CVA tenderness; Extremities: Pulses normal, No tenderness, No edema, No calf edema or  asymmetry.; Neuro: AA&Ox3, Major CN grossly intact. Speech clear.  No facial droop. +left horizontal end gaze fatigable nystagmus. Grips equal. Strength 5/5 equal bilat UE's and LE's.  DTR 2/4 equal bilat UE's and LE's.  No gross sensory deficits.  Normal cerebellar testing bilat UE's (finger-nose) and LE's (heel-shin).; Skin: Color normal, Warm, Dry.   ED Treatments / Results  Labs (all labs ordered are listed, but only abnormal results are displayed)   EKG  EKG Interpretation  Date/Time:  Thursday July 14 2016 18:01:02 EST Ventricular Rate:  94 PR Interval:  148 QRS Duration: 82 QT Interval:  340 QTC Calculation: 425 R Axis:   53 Text Interpretation:  Normal sinus rhythm Normal ECG since last tracing no significant change Confirmed by Effie ShyWENTZ  MD, Mechele CollinELLIOTT ([id number]) on 07/14/2016 6:05:02 PM       Radiology   Procedures Procedures (including critical care time)  Medications Ordered in ED Medications  iopamidol (ISOVUE-300) 61 % injection (not administered)  ondansetron (ZOFRAN-ODT) disintegrating tablet 8 mg (8  mg Oral Given 07/14/16 1959)  meclizine (ANTIVERT) tablet 25 mg (25 mg Oral Given 07/14/16 2000)     Initial Impression / Assessment and Plan / ED Course  I have reviewed the triage vital signs and the nursing notes.  Pertinent labs & imaging results that were available during my care of the patient were reviewed by me and considered in my medical decision making (see chart for details).  MDM Reviewed: previous chart, nursing note and vitals Reviewed previous: labs and ECG Interpretation: labs, ECG, x-ray and CT scan   Results for orders placed or performed during the hospital encounter of 07/14/16  CBC with Differential  Result Value Ref Range   WBC 11.8 (H) 4.0 - 10.5 K/uL   RBC 4.28 3.87 - 5.11 MIL/uL   Hemoglobin 14.2 12.0 - 15.0 g/dL   HCT 21.3 08.6 - 57.8 %   MCV 98.4 78.0 - 100.0 fL   MCH 33.2 26.0 - 34.0 pg   MCHC 33.7 30.0 - 36.0 g/dL   RDW  46.9 62.9 - 52.8 %   Platelets 417 (H) 150 - 400 K/uL   Neutrophils Relative % 53 %   Neutro Abs 6.3 1.7 - 7.7 K/uL   Lymphocytes Relative 34 %   Lymphs Abs 4.0 0.7 - 4.0 K/uL   Monocytes Relative 9 %   Monocytes Absolute 1.1 (H) 0.1 - 1.0 K/uL   Eosinophils Relative 4 %   Eosinophils Absolute 0.4 0.0 - 0.7 K/uL   Basophils Relative 0 %   Basophils Absolute 0.0 0.0 - 0.1 K/uL  Comprehensive metabolic panel  Result Value Ref Range   Sodium 137 135 - 145 mmol/L   Potassium 3.7 3.5 - 5.1 mmol/L   Chloride 105 101 - 111 mmol/L   CO2 24 22 - 32 mmol/L   Glucose, Bld 101 (H) 65 - 99 mg/dL   BUN 16 6 - 20 mg/dL   Creatinine, Ser 4.13 0.44 - 1.00 mg/dL   Calcium 8.8 (L) 8.9 - 10.3 mg/dL   Total Protein 7.2 6.5 - 8.1 g/dL   Albumin 3.9 3.5 - 5.0 g/dL   AST 21 15 - 41 U/L   ALT 19 14 - 54 U/L   Alkaline Phosphatase 48 38 - 126 U/L   Total Bilirubin 0.4 0.3 - 1.2 mg/dL   GFR calc non Af Amer >60 >60 mL/min   GFR calc Af Amer >60 >60 mL/min   Anion gap 8 5 - 15  Lactic acid, plasma  Result Value Ref Range   Lactic Acid, Venous 1.3 0.5 - 1.9 mmol/L  Urinalysis, Routine w reflex microscopic  Result Value Ref Range   Color, Urine YELLOW YELLOW   APPearance HAZY (A) CLEAR   Specific Gravity, Urine 1.011 1.005 - 1.030   pH 6.0 5.0 - 8.0   Glucose, UA NEGATIVE NEGATIVE mg/dL   Hgb urine dipstick NEGATIVE NEGATIVE   Bilirubin Urine NEGATIVE NEGATIVE   Ketones, ur NEGATIVE NEGATIVE mg/dL   Protein, ur NEGATIVE NEGATIVE mg/dL   Nitrite NEGATIVE NEGATIVE   Leukocytes, UA TRACE (A) NEGATIVE   RBC / HPF 0-5 0 - 5 RBC/hpf   WBC, UA 0-5 0 - 5 WBC/hpf   Bacteria, UA NONE SEEN NONE SEEN   Mucous PRESENT    Hyaline Casts, UA PRESENT   Lipase, blood  Result Value Ref Range   Lipase 13 11 - 51 U/L  Troponin I  Result Value Ref Range   Troponin I <0.03 <0.03 ng/mL  POC occult  blood, ED  Result Value Ref Range   Fecal Occult Bld POSITIVE (A) NEGATIVE    Ct Head Wo Contrast Result Date:  07/14/2016 CLINICAL DATA:  Initial evaluation for acute dizziness, standing. EXAM: CT HEAD WITHOUT CONTRAST TECHNIQUE: Contiguous axial images were obtained from the base of the skull through the vertex without intravenous contrast. COMPARISON:  None available. FINDINGS: Brain: Cerebral volume within normal limits for age. The superimposed mild chronic microvascular ischemic changes present. No evidence for acute intracranial hemorrhage. No evidence for acute large vessel territory infarct. No mass lesion, midline shift or mass effect. No hydrocephalus. No extra-axial fluid collection. Vascular: No hyperdense vessel. Skull: Scalp soft tissues within normal limits.  Calvarium intact. Sinuses/Orbits: Globes and orbital soft tissues within normal limits. Paranasal sinuses are clear. No mastoid effusion. IMPRESSION: 1. No acute intracranial process identified. 2. Mild chronic small vessel ischemic disease. Electronically Signed   By: Rise MuBenjamin  McClintock M.D.   On: 07/14/2016 22:11   Ct Abdomen Pelvis W Contrast Result Date: 07/14/2016 CLINICAL DATA:  Initial evaluation for acute diarrhea, black stools. History of diverticulitis. EXAM: CT ABDOMEN AND PELVIS WITH CONTRAST TECHNIQUE: Multidetector CT imaging of the abdomen and pelvis was performed using the standard protocol following bolus administration of intravenous contrast. CONTRAST:  100mL ISOVUE-300 IOPAMIDOL (ISOVUE-300) INJECTION 61% COMPARISON:  None. FINDINGS: Lower chest: Mild subsegmental atelectasis seen dependently within the right lung base. Visualized lungs are otherwise clear. Hepatobiliary: Diffuse hypoattenuation of the liver compatible steatosis. Liver otherwise unremarkable. Gallbladder within normal limits. No biliary dilatation. Pancreas: Fatty atrophy of the pancreas noted. Pancreas otherwise unremarkable without acute inflammatory changes. Spleen: Spleen within normal limits. Adrenals/Urinary Tract: Adrenal glands are normal. Kidneys equal  in size with symmetric enhancement. Subcentimeter hypodensities within the kidneys bilaterally too small characterize, but statistically likely reflects small cyst. No nephrolithiasis, hydronephrosis, or focal enhancing renal mass. Ureters of normal caliber without acute abnormality. Bladder within normal limits. Stomach/Bowel: Small hiatal hernia noted. Stomach otherwise unremarkable. No evidence for bowel obstruction. No acute inflammatory changes seen about the small bowel. Appendix within normal limits. Ascending and transverse colon within normal limits. The descending and sigmoid colon are diffusely decompressed. Mild circumferential wall thickening with subtle hazy stranding suspicious for possible acute colitis given the patient's symptoms. No evidence for associated obstruction or other complication. Vascular/Lymphatic: Normal intravascular enhancement seen throughout the intra-abdominal aorta and its branch vessels. 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 No aneurysm. No adenopathy. Reproductive: Uterus within normal limits. Ovaries appear to be absent. Other: No free air or fluid. Musculoskeletal: No acute osseous abnormality. No worrisome lytic or blastic osseous lesions. IMPRESSION: 1. Mild circumferential wall thickening with hazy fat stranding about the descending and proximal sigmoid colon, suspicious for acute colitis given the patient's symptoms. No evidence for obstruction or other complication identified. 2. No other acute intra-abdominal or pelvic process. 3. Hepatic steatosis. Electronically Signed   By: Rise MuBenjamin  McClintock M.D.   On: 07/14/2016 22:19    2240:  Pt given antivert for vertigo. Pt ambulated with continued vertigo symptoms; will dose valium. No vomiting in ED. IV abx started for colitis. Concerned regarding d/c due to continued vertigo symptoms, diarrhea, and pt needing to care for elderly husband with dementia. Dx and testing d/w pt and family.  Questions  answered.  Verb understanding, agreeable to observation admit.  T/C to [facility] Dr. Ophelia CharterYates, case discussed, including:  HPI, pertinent PM/SHx, VS/PE, dx testing, ED course and treatment:  Agreeable to admit, requests to write temporary orders,  obtain observation medical bed to team [facility].   Final Clinical Impressions(s) / ED Diagnoses   Final diagnoses:  None    New Prescriptions New Prescriptions   No medications on file     Samuel Jester, DO 07/18/16 1659

## 2016-07-15 ENCOUNTER — Observation Stay (HOSPITAL_COMMUNITY): Payer: Medicare Other

## 2016-07-15 DIAGNOSIS — M797 Fibromyalgia: Secondary | ICD-10-CM | POA: Diagnosis present

## 2016-07-15 DIAGNOSIS — Z7982 Long term (current) use of aspirin: Secondary | ICD-10-CM | POA: Diagnosis not present

## 2016-07-15 DIAGNOSIS — B0229 Other postherpetic nervous system involvement: Secondary | ICD-10-CM | POA: Diagnosis present

## 2016-07-15 DIAGNOSIS — K219 Gastro-esophageal reflux disease without esophagitis: Secondary | ICD-10-CM | POA: Diagnosis present

## 2016-07-15 DIAGNOSIS — Z8673 Personal history of transient ischemic attack (TIA), and cerebral infarction without residual deficits: Secondary | ICD-10-CM | POA: Diagnosis not present

## 2016-07-15 DIAGNOSIS — I10 Essential (primary) hypertension: Secondary | ICD-10-CM | POA: Diagnosis present

## 2016-07-15 DIAGNOSIS — E785 Hyperlipidemia, unspecified: Secondary | ICD-10-CM | POA: Diagnosis present

## 2016-07-15 DIAGNOSIS — R42 Dizziness and giddiness: Secondary | ICD-10-CM | POA: Diagnosis not present

## 2016-07-15 DIAGNOSIS — K529 Noninfective gastroenteritis and colitis, unspecified: Secondary | ICD-10-CM | POA: Diagnosis present

## 2016-07-15 DIAGNOSIS — H8110 Benign paroxysmal vertigo, unspecified ear: Secondary | ICD-10-CM | POA: Diagnosis present

## 2016-07-15 DIAGNOSIS — F419 Anxiety disorder, unspecified: Secondary | ICD-10-CM | POA: Diagnosis present

## 2016-07-15 DIAGNOSIS — Z800 Family history of malignant neoplasm of digestive organs: Secondary | ICD-10-CM | POA: Diagnosis not present

## 2016-07-15 DIAGNOSIS — Z888 Allergy status to other drugs, medicaments and biological substances status: Secondary | ICD-10-CM | POA: Diagnosis not present

## 2016-07-15 DIAGNOSIS — F329 Major depressive disorder, single episode, unspecified: Secondary | ICD-10-CM | POA: Diagnosis present

## 2016-07-15 DIAGNOSIS — Z8249 Family history of ischemic heart disease and other diseases of the circulatory system: Secondary | ICD-10-CM | POA: Diagnosis not present

## 2016-07-15 DIAGNOSIS — M1990 Unspecified osteoarthritis, unspecified site: Secondary | ICD-10-CM | POA: Diagnosis present

## 2016-07-15 DIAGNOSIS — I639 Cerebral infarction, unspecified: Secondary | ICD-10-CM | POA: Diagnosis present

## 2016-07-15 LAB — CBC
HCT: 36 % (ref 36.0–46.0)
Hemoglobin: 11.8 g/dL — ABNORMAL LOW (ref 12.0–15.0)
MCH: 32.7 pg (ref 26.0–34.0)
MCHC: 32.8 g/dL (ref 30.0–36.0)
MCV: 99.7 fL (ref 78.0–100.0)
Platelets: 382 10*3/uL (ref 150–400)
RBC: 3.61 MIL/uL — AB (ref 3.87–5.11)
RDW: 12 % (ref 11.5–15.5)
WBC: 9.7 10*3/uL (ref 4.0–10.5)

## 2016-07-15 LAB — BASIC METABOLIC PANEL
ANION GAP: 7 (ref 5–15)
BUN: 12 mg/dL (ref 6–20)
CALCIUM: 7.8 mg/dL — AB (ref 8.9–10.3)
CHLORIDE: 109 mmol/L (ref 101–111)
CO2: 24 mmol/L (ref 22–32)
Creatinine, Ser: 0.67 mg/dL (ref 0.44–1.00)
GFR calc non Af Amer: >60 mL/min (ref >60)
Glucose, Bld: 96 mg/dL (ref 65–99)
Potassium: 3.7 mmol/L (ref 3.5–5.1)
Sodium: 140 mmol/L (ref 135–145)

## 2016-07-15 MED ORDER — ASPIRIN 325 MG PO TABS
325.0000 mg | ORAL_TABLET | Freq: Every day | ORAL | Status: DC
  Administered 2016-07-15 – 2016-07-16 (×2): 325 mg via ORAL
  Filled 2016-07-15 (×2): qty 1

## 2016-07-15 MED ORDER — MECLIZINE HCL 12.5 MG PO TABS
25.0000 mg | ORAL_TABLET | Freq: Three times a day (TID) | ORAL | Status: DC | PRN
  Administered 2016-07-15 – 2016-07-16 (×3): 25 mg via ORAL
  Filled 2016-07-15 (×4): qty 2

## 2016-07-15 MED ORDER — CIPROFLOXACIN HCL 250 MG PO TABS
500.0000 mg | ORAL_TABLET | Freq: Two times a day (BID) | ORAL | Status: DC
  Administered 2016-07-15 – 2016-07-16 (×3): 500 mg via ORAL
  Filled 2016-07-15 (×3): qty 2

## 2016-07-15 MED ORDER — PREGABALIN 25 MG PO CAPS
25.0000 mg | ORAL_CAPSULE | Freq: Two times a day (BID) | ORAL | Status: DC
  Administered 2016-07-15 – 2016-07-16 (×2): 25 mg via ORAL
  Filled 2016-07-15 (×3): qty 1

## 2016-07-15 MED ORDER — STROKE: EARLY STAGES OF RECOVERY BOOK
Freq: Once | Status: AC
  Administered 2016-07-15: 13:00:00
  Filled 2016-07-15: qty 1

## 2016-07-15 MED ORDER — ASPIRIN 81 MG PO CHEW
81.0000 mg | CHEWABLE_TABLET | Freq: Every day | ORAL | Status: DC
  Administered 2016-07-15: 81 mg via ORAL
  Filled 2016-07-15: qty 1

## 2016-07-15 MED ORDER — MORPHINE SULFATE (PF) 2 MG/ML IV SOLN: 2.0000 mg | INTRAVENOUS | Status: DC | PRN

## 2016-07-15 MED ORDER — ONDANSETRON HCL 4 MG PO TABS: 4.0000 mg | ORAL_TABLET | Freq: Four times a day (QID) | ORAL | Status: DC | PRN

## 2016-07-15 MED ORDER — ACETAMINOPHEN 325 MG PO TABS
650.0000 mg | ORAL_TABLET | Freq: Four times a day (QID) | ORAL | Status: DC | PRN
  Administered 2016-07-15 – 2016-07-16 (×3): 650 mg via ORAL
  Filled 2016-07-15 (×3): qty 2

## 2016-07-15 MED ORDER — METRONIDAZOLE 500 MG PO TABS
500.0000 mg | ORAL_TABLET | Freq: Three times a day (TID) | ORAL | Status: DC
  Administered 2016-07-15 – 2016-07-16 (×5): 500 mg via ORAL
  Filled 2016-07-15 (×5): qty 1

## 2016-07-15 MED ORDER — ACETAMINOPHEN 650 MG RE SUPP: 650.0000 mg | Freq: Four times a day (QID) | RECTAL | Status: DC | PRN

## 2016-07-15 MED ORDER — ENOXAPARIN SODIUM 40 MG/0.4ML ~~LOC~~ SOLN
40.0000 mg | SUBCUTANEOUS | Status: DC
  Administered 2016-07-15 – 2016-07-16 (×2): 40 mg via SUBCUTANEOUS
  Filled 2016-07-15 (×2): qty 0.4

## 2016-07-15 MED ORDER — ONDANSETRON HCL 4 MG/2ML IJ SOLN: 4.0000 mg | Freq: Four times a day (QID) | INTRAMUSCULAR | Status: DC | PRN

## 2016-07-15 NOTE — Care Management Note (Addendum)
Case Management Note  Patient Details  Name: Alexis KinsmanCarolyn H Barnes MRN: [id number] Date of Birth: 13-Mar-1946  Subjective/Objective: Patient adm from home with dizziness. Patient lives alone and is ind with ADL's. Did mention to patient that PT eval is pending, patient states she does not need any PT or home health.  At the time of assessment, patient was experiencing some "slower than normal speech", nurse aware and assessing. MD had been paged.  Action/Plan: CM to follow for needs.   Later entry: PT recommends OP PT. Patient would like to use [facility]. Called [facility] at [phone] and left message including patient name/ DOB and referral needs. Awaiting call back.  Updated patient on leaving message for [facility], she know employees there personally and agrees to call them if they have not called her. OP PT ordered and asked MD to co-sign.   Expected Discharge Date:     07/16/2016             Expected Discharge Plan:  Home/Self Care  In-House Referral:  NA  Discharge planning Services  CM Consult  Post Acute Care Choice:  NA Choice offered to:  NA  DME Arranged:    DME Agency:     HH Arranged:    HH Agency:     Status of Service:  In process, will continue to follow  If discussed at Long Length of Stay Meetings, dates discussed:    Additional Comments:  Sheyanne Munley, Chrystine OilerSharley Diane, RN 07/15/2016, 12:36 PM

## 2016-07-15 NOTE — Progress Notes (Signed)
PROGRESS NOTE    Alexis KinsmanCarolyn H Dada  NGE:[id number]:[id number] DOB: 07-25-1946 DOA: 07/14/2016 PCP: Valla LeaverEGGLESTON-CLARK,VALENICA, MD    Brief Narrative:  70 year old with history of hyperlipidemia, hypertension, history of stroke, fibromyalgia, history of diverticulitis who presented to the hospital with complaints of vertigo. Further evaluation with MRI would raise suspicion for stroke. Neurologist consulted   Assessment & Plan:   Principal Problem:   Vertigo - MRI raise suspicion for stroke. Patient also complaining of other problems like mild right-sided weakness of upper extremity when compared to left and trouble finding words. Of note on MRI of brain at reports remote infarcts. Given findings I have consulted neurology for further evaluation  -Place consult for physical therapist  Active Problems:   GERD (gastroesophageal reflux disease) - Stable    Colitis -Continue oral antibiotic regimen, leukocytosis resolved    Heme positive stool - Recommend patient follow-up with gastroenterologist    Postherpetic neuralgia - stable  Anemia - No active bleeding reported at suspect patient had some dilutional effect secondary to getting IV fluids.   DVT prophylaxis: Lovenox Code Status: Full Family Communication: none at bedside Disposition Plan: Pending improvement in condition   Consultants:   None  Procedures: None   Antimicrobials: None   Subjective: Patient is concerned about vertigo and has many questions for the neurologist. From my standpoint I answered questions as best as I could. There he currently awaiting neurology evaluation.  Objective: Vitals:   07/14/16 2300 07/15/16 0037 07/15/16 0610 07/15/16 0852  BP: 127/61 (!) 121/50 (!) 121/48 (!) 133/53  Pulse: 86 92 81 81  Resp:  17 17 18   Temp:  97.9 F (36.6 C) 97.9 F (36.6 C) 97.7 F (36.5 C)  TempSrc:  Oral Oral Oral  SpO2: 98% 98% 97% 98%  Weight:  88.1 kg (194 lb 3.2 oz)    Height:         Intake/Output Summary (Last 24 hours) at 07/15/16 1418 Last data filed at 07/15/16 1300  Gross per 24 hour  Intake             1080 ml  Output             1250 ml  Net             -170 ml   Filed Weights   07/14/16 1750 07/15/16 0037  Weight: 83.9 kg (185 lb) 88.1 kg (194 lb 3.2 oz)    Examination:  General exam: Appears calm and comfortable, No acute distress  Respiratory system: Clear to auscultation. Respiratory effort normal. Cardiovascular system: S1 & S2 heard, RRR. No JVD, murmurs, rubs, gallops or clicks. No pedal edema. Gastrointestinal system: Abdomen is nondistended, soft and nontender. No organomegaly or masses felt. Normal bowel sounds heard. Central nervous system: Alert and oriented.Patient has word finding difficulty.Positive nystagmus  Extremities: Symmetric 5 x 5 power. Skin: No rashes, lesions or ulcersCommonly limited exam  Psychiatry: Judgement and insight appear normal. Mood & affect appropriate.     Data Reviewed: I have personally reviewed following labs and imaging studies  CBC:  Recent Labs Lab 07/14/16 1946 07/15/16 0448  WBC 11.8* 9.7  NEUTROABS 6.3  --   HGB 14.2 11.8*  HCT 42.1 36.0  MCV 98.4 99.7  PLT 417* 382   Basic Metabolic Panel:  Recent Labs Lab 07/14/16 1946 07/15/16 0448  NA 137 140  K 3.7 3.7  CL 105 109  CO2 24 24  GLUCOSE 101* 96  BUN 16 12  CREATININE 0.90 0.67  CALCIUM 8.8* 7.8*   GFR: Estimated Creatinine Clearance: 66.9 mL/min (by C-G formula based on SCr of 0.67 mg/dL). Liver Function Tests:  Recent Labs Lab 07/14/16 1946  AST 21  ALT 19  ALKPHOS 48  BILITOT 0.4  PROT 7.2  ALBUMIN 3.9    Recent Labs Lab 07/14/16 1947  LIPASE 13   No results for input(s): AMMONIA in the last 168 hours. Coagulation Profile: No results for input(s): INR, PROTIME in the last 168 hours. Cardiac Enzymes:  Recent Labs Lab 07/14/16 1947  TROPONINI <0.03   BNP (last 3 results) No results for input(s):  PROBNP in the last 8760 hours. HbA1C: No results for input(s): HGBA1C in the last 72 hours. CBG: No results for input(s): GLUCAP in the last 168 hours. Lipid Profile: No results for input(s): CHOL, HDL, LDLCALC, TRIG, CHOLHDL, LDLDIRECT in the last 72 hours. Thyroid Function Tests: No results for input(s): TSH, T4TOTAL, FREET4, T3FREE, THYROIDAB in the last 72 hours. Anemia Panel: No results for input(s): VITAMINB12, FOLATE, FERRITIN, TIBC, IRON, RETICCTPCT in the last 72 hours. Sepsis Labs:  Recent Labs Lab 07/14/16 1946  LATICACIDVEN 1.3    No results found for this or any previous visit (from the past 240 hour(s)).       Radiology Studies: Ct Head Wo Contrast  Result Date: 07/14/2016 CLINICAL DATA:  Initial evaluation for acute dizziness, standing. EXAM: CT HEAD WITHOUT CONTRAST TECHNIQUE: Contiguous axial images were obtained from the base of the skull through the vertex without intravenous contrast. COMPARISON:  None available. FINDINGS: Brain: Cerebral volume within normal limits for age. The superimposed mild chronic microvascular ischemic changes present. No evidence for acute intracranial hemorrhage. No evidence for acute large vessel territory infarct. No mass lesion, midline shift or mass effect. No hydrocephalus. No extra-axial fluid collection. Vascular: No hyperdense vessel. Skull: Scalp soft tissues within normal limits.  Calvarium intact. Sinuses/Orbits: Globes and orbital soft tissues within normal limits. Paranasal sinuses are clear. No mastoid effusion. IMPRESSION: 1. No acute intracranial process identified. 2. Mild chronic small vessel ischemic disease. Electronically Signed   By: Rise Mu M.D.   On: 07/14/2016 22:11   Mr Alexis Barnes ZO Contrast  Result Date: 07/15/2016 CLINICAL DATA:  Acute onset of dizziness today. EXAM: MRA HEAD WITHOUT CONTRAST TECHNIQUE: Angiographic images of the Circle of Willis were obtained using MRA technique without  intravenous contrast. COMPARISON:  None. FINDINGS: Atherosclerotic changes are present within the cavernous internal carotid arteries without a significant stenosis relative to the more distal vessel. The A1 and M1 segments are normal. The anterior communicating artery is patent. MCA bifurcations are intact. Mild distal small vessel disease is present without a significant proximal stenosis or occlusion within the ACA or MCA branch vessels. The right vertebral artery is the dominant vessel. The left vertebral artery is not visualized. The basilar artery is small and essentially terminates at the superior cerebellar arteries. Fetal type posterior cerebral arteries are present bilaterally. There is some attenuation of distal PCA branch vessels. IMPRESSION: 1. Mild atherosclerotic changes within the cavernous internal carotid arteries bilaterally without a significant stenosis. 2. The left vertebral artery is not visualized. This may be a hypoplastic vessel or represent occlusion. 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 Electronically Signed   By: Marin Roberts M.D.   On: 07/15/2016 10:35   Mr Brain Wo Contrast  Result Date: 07/15/2016 CLINICAL DATA:  New onset of dizziness today. Symptoms began after episode of diarrhea. EXAM: MRI HEAD  WITHOUT CONTRAST TECHNIQUE: Multiplanar, multiecho pulse sequences of the brain and surrounding structures were obtained without intravenous contrast. COMPARISON:  CT head without contrast from the same day. FINDINGS: Brain: The diffusion-weighted images demonstrate no acute or subacute infarction. Mild periventricular T2 changes are evident bilaterally. T2 signal change volume loss in the left frontal operculum is compatible with a remote infarct. No acute hemorrhage or mass lesion is present. A remote lacunar infarct is evident within the left thalamus. Remote lacunar infarcts are present within the right cerebellum. The  brainstem is within normal limits. Vascular: Flow is present in the major intracranial arteries. Skull and upper cervical spine: The skullbase is within normal limits. Midline intracranial sagittal structures are normal. Disc disease is again noted at C3-4. Marrow signal is normal. Sinuses/Orbits: The paranasal sinuses and mastoid air cells are clear. The globes and orbits are within normal limits. IMPRESSION: 1. No acute intracranial abnormality. 2. Mild age advanced white matter disease. The finding is nonspecific but can be seen in the setting of chronic microvascular ischemia, a demyelinating process such as multiple sclerosis, vasculitis, complicated migraine headaches, or as the sequelae of a prior infectious or inflammatory process. 3. 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 4. Remote lacunar infarcts of the left thalamus and right cerebellum. Electronically Signed   By: Marin Roberts M.D.   On: 07/15/2016 10:33   Ct Abdomen Pelvis W Contrast  Result Date: 07/14/2016 CLINICAL DATA:  Initial evaluation for acute diarrhea, black stools. History of diverticulitis. EXAM: CT ABDOMEN AND PELVIS WITH CONTRAST TECHNIQUE: Multidetector CT imaging of the abdomen and pelvis was performed using the standard protocol following bolus administration of intravenous contrast. CONTRAST:  ISOVUE-300 IOPAMIDOL (ISOVUE-300) INJECTION 61% COMPARISON:  None. FINDINGS: Lower chest: Mild subsegmental atelectasis seen dependently within the right lung base. Visualized lungs are otherwise clear. Hepatobiliary: Diffuse hypoattenuation of the liver compatible steatosis. Liver otherwise unremarkable. Gallbladder within normal limits. No biliary dilatation. Pancreas: Fatty atrophy of the pancreas noted. Pancreas otherwise unremarkable without acute inflammatory changes. Spleen: Spleen within normal limits. Adrenals/Urinary Tract: Adrenal glands are normal. Kidneys equal in size with symmetric enhancement.  Subcentimeter hypodensities within the kidneys bilaterally too small characterize, but statistically likely reflects small cyst. No nephrolithiasis, hydronephrosis, or focal enhancing renal mass. Ureters of normal caliber without acute abnormality. Bladder within normal limits. Stomach/Bowel: Small hiatal hernia noted. Stomach otherwise unremarkable. No evidence for bowel obstruction. No acute inflammatory changes seen about the small bowel. Appendix within normal limits. Ascending and transverse colon within normal limits. The descending and sigmoid colon are diffusely decompressed. Mild circumferential wall thickening with subtle hazy stranding suspicious for possible acute colitis given the patient's symptoms. No evidence for associated obstruction or other complication. Vascular/Lymphatic: Normal intravascular enhancement seen throughout the intra-abdominal aorta and its branch vessels. 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 No aneurysm. No adenopathy. Reproductive: Uterus within normal limits. Ovaries appear to be absent. Other: No free air or fluid. Musculoskeletal: No acute osseous abnormality. No worrisome lytic or blastic osseous lesions. IMPRESSION: 1. Mild circumferential wall thickening with hazy fat stranding about the descending and proximal sigmoid colon, suspicious for acute colitis given the patient's symptoms. No evidence for obstruction or other complication identified. 2. No other acute intra-abdominal or pelvic process. 3. Hepatic steatosis. Electronically Signed   By: Rise Mu M.D.   On: 07/14/2016 22:19        Scheduled Meds: .  stroke: mapping our early stages of recovery book   Does not  apply Once  . aspirin  81 mg Oral Daily  . ciprofloxacin  500 mg Oral BID  . enoxaparin (LOVENOX) injection  40 mg Subcutaneous Q24H  . metroNIDAZOLE  500 mg Oral Q8H  . pantoprazole  40 mg Oral BID  . pregabalin  25 mg Oral BID   Continuous Infusions: . sodium  chloride 1,000 mL (07/14/16 2313)     LOS: 0 days    Time spent: > 35 minutes  Penny PiaVEGA, Hoy Fallert, MD [facility] Pager [phone] If 7PM-7AM, please contact night-coverage [email or web address] Password [email or web address] 07/15/2016, 2:18 PM

## 2016-07-15 NOTE — Consult Note (Addendum)
[place] A. Merlene Laughter, MD     [email or web address]          Alexis Barnes is an 70 y.o. female.   ASSESSMENT/PLAN: Acute vertiginous symptoms due to benign paroxysmal positional vertigo.  Multiple chronic ischemic infarcts.  Statin myalgias.      RECOMMENDATION: Otolith repositioning therapy is suggested but is not available at this time. I have therefore instructed the family and patient to do habituation exercises. This is explained and demonstrated the bedside. Patient to lay supine position and turn her body to the left 10 times twice a day. If this is not successful then we can try to arrange for otolith repositioning therapy through the physical therapy department. Medication - meclizine or Valium - is suggested as a last resort.    The patient aspirin will be increased to 325 mg.       The patient is 70 year old white female who presents with acute onset of vertiginous symptoms described as a spinning like sensation. The patient noted this on yesterday morning when she got up to use the restroom. She noted that when she went back in bed she had significant spinning sensation. This happened 3 times. This pain was quite severe but only lasted for about a minute. She does not report focal neurological symptoms other over daughter thought that her speech was somewhat slightly slurry. She denies headache, chest pain, focal numbness or weakness. She reports that she did have some mild dyspnea but does have dyspnea from time to time. The patient does not report loss of consciousness, seizures or tremors.   The patient was admitted to the [facility] 2 years ago for syncope. That event was associated with dizziness on standing and frank loss of consciousness. It turns out the patient had significant colitis and dehydration during this event. She was treated at [facility]. She tells me she has been told that she has had previous stroke  although it appears these may have been asymptomatic relatively speaking. Statin meds causes joint muscles ach. Review of systems otherwise negative.      GENERAL: This a pleasant female in no acute distress.  HEENT: Help maneuver demonstrated at the bedside shows classic torsional nystagmus with the left ear down. There is a mild delay noted. Maneuver was not positive with the right ear down.  ABDOMEN: soft  EXTREMITIES: No edema   BACK: Normal  SKIN: Normal by inspection.    MENTAL STATUS: Alert and oriented. Speech, language and cognition are generally intact. Judgment and insight normal.   CRANIAL NERVES: Pupils are equal, round and reactive to light and accomodation; extra ocular movements are full, there is no significant nystagmus; visual fields are full; upper and lower facial muscles are normal in strength and symmetric, there is no flattening of the nasolabial folds; tongue is midline; uvula is midline; shoulder elevation is normal.  MOTOR: Normal tone, bulk and strength; no pronator drift.  COORDINATION: Left finger to nose is normal, right finger to nose is normal, No rest tremor; no intention tremor; no postural tremor; no bradykinesia.  REFLEXES: Deep tendon reflexes are symmetrical and normal. Babinski reflexes are flexor bilaterally.   SENSATION: Normal to light touch, temperature, and pinprick.  GAIT: This is relatively good.                Blood pressure (!) 133/53, pulse 81, temperature 97.7 F (36.5 C), temperature source Oral, resp. rate 18, height 5' 3" (1.6 m), weight 194 lb 3.2 oz (  88.1 kg), SpO2 98 %.  Past Medical History:  Diagnosis Date  . Anxiety   . Arthritis   . Depression   . Essential hypertension   . Fibromyalgia   . GERD (gastroesophageal reflux disease)   . Hyperlipidemia   . Infectious colitis    Associated with syncope and sepsis, managed at [facility] January 2015  . Postherpetic neuralgia   . Vertigo     Past Surgical  History:  Procedure Laterality Date  . COLONOSCOPY N/A 12/27/2013   Procedure: COLONOSCOPY;  Surgeon: Rogene Houston, MD;  Location: [facility];  Service: Endoscopy;  Laterality: N/A;  955  . ROBOTIC ASSISTED BILATERAL SALPINGO OOPHERECTOMY Bilateral 01/30/2013   Procedure: ROBOTIC ASSISTED BILATERAL SALPINGO OOPHORECTOMY;  Surgeon: Allyn Kenner, DO;  Location: [facility];  Service: Gynecology;  Laterality: Bilateral;  . Rotator cuff surgery Right 2011  . TUBAL LIGATION    . VAGINAL DELIVERY  [id number]    Family History  Problem Relation Age of Onset  . Heart attack Father     Age 85  . Colon cancer Sister 59    Social History:  reports that she has never smoked. She has never used smokeless tobacco. She reports that she does not drink alcohol or use drugs.  Allergies:  Allergies  Allergen Reactions  . Statins Other (See Comments)    Muscle aches     Medications: Prior to Admission medications   Medication Sig Start Date End Date Taking? Authorizing Provider  Ascorbic Acid (VITAMIN C) 100 MG tablet Take 100 mg by mouth daily.   Yes Historical Provider, MD  aspirin 81 MG chewable tablet Chew 81 mg by mouth daily.   Yes Historical Provider, MD  B Complex Vitamins (VITAMIN B COMPLEX PO) Take 1 tablet by mouth 2 (two) times daily.   Yes Historical Provider, MD  ezetimibe (ZETIA) 10 MG tablet Take 10 mg by mouth daily.   Yes Historical Provider, MD  pregabalin (LYRICA) 25 MG capsule Take 25 mg by mouth 2 (two) times daily.   Yes Historical Provider, MD  Vitamin D, Cholecalciferol, 1000 UNITS TABS Take 1 tablet by mouth 3 (three) times daily.   Yes Historical Provider, MD    Scheduled Meds: . aspirin  81 mg Oral Daily  . ciprofloxacin  500 mg Oral BID  . enoxaparin (LOVENOX) injection  40 mg Subcutaneous Q24H  . metroNIDAZOLE  500 mg Oral Q8H  . pantoprazole  40 mg Oral BID  . pregabalin  25 mg Oral BID   Continuous Infusions: . sodium chloride 1,000 mL (07/14/16 2313)    PRN Meds:.acetaminophen **OR** acetaminophen, meclizine, morphine injection, ondansetron **OR** ondansetron (ZOFRAN) IV     Results for orders placed or performed during the hospital encounter of 07/14/16 (from the past 48 hour(s))  Urinalysis, Routine w reflex microscopic     Status: Abnormal   Collection Time: 07/14/16  6:18 PM  Result Value Ref Range   Color, Urine YELLOW YELLOW   APPearance HAZY (A) CLEAR   Specific Gravity, Urine 1.011 1.005 - 1.030   pH 6.0 5.0 - 8.0   Glucose, UA NEGATIVE NEGATIVE mg/dL   Hgb urine dipstick NEGATIVE NEGATIVE   Bilirubin Urine NEGATIVE NEGATIVE   Ketones, ur NEGATIVE NEGATIVE mg/dL   Protein, ur NEGATIVE NEGATIVE mg/dL   Nitrite NEGATIVE NEGATIVE   Leukocytes, UA TRACE (A) NEGATIVE   RBC / HPF 0-5 0 - 5 RBC/hpf   WBC, UA 0-5 0 - 5 WBC/hpf   Bacteria, UA  NONE SEEN NONE SEEN   Mucous PRESENT    Hyaline Casts, UA PRESENT   CBC with Differential     Status: Abnormal   Collection Time: 07/14/16  7:46 PM  Result Value Ref Range   WBC 11.8 (H) 4.0 - 10.5 K/uL   RBC 4.28 3.87 - 5.11 MIL/uL   Hemoglobin 14.2 12.0 - 15.0 g/dL   HCT 42.1 36.0 - 46.0 %   MCV 98.4 78.0 - 100.0 fL   MCH 33.2 26.0 - 34.0 pg   MCHC 33.7 30.0 - 36.0 g/dL   RDW 12.2 11.5 - 15.5 %   Platelets 417 (H) 150 - 400 K/uL   Neutrophils Relative % 53 %   Neutro Abs 6.3 1.7 - 7.7 K/uL   Lymphocytes Relative 34 %   Lymphs Abs 4.0 0.7 - 4.0 K/uL   Monocytes Relative 9 %   Monocytes Absolute 1.1 (H) 0.1 - 1.0 K/uL   Eosinophils Relative 4 %   Eosinophils Absolute 0.4 0.0 - 0.7 K/uL   Basophils Relative 0 %   Basophils Absolute 0.0 0.0 - 0.1 K/uL  Comprehensive metabolic panel     Status: Abnormal   Collection Time: 07/14/16  7:46 PM  Result Value Ref Range   Sodium 137 135 - 145 mmol/L   Potassium 3.7 3.5 - 5.1 mmol/L   Chloride 105 101 - 111 mmol/L   CO2 24 22 - 32 mmol/L   Glucose, Bld 101 (H) 65 - 99 mg/dL   BUN 16 6 - 20 mg/dL   Creatinine, Ser 0.90 0.44 - 1.00  mg/dL   Calcium 8.8 (L) 8.9 - 10.3 mg/dL   Total Protein 7.2 6.5 - 8.1 g/dL   Albumin 3.9 3.5 - 5.0 g/dL   AST 21 15 - 41 U/L   ALT 19 14 - 54 U/L   Alkaline Phosphatase 48 38 - 126 U/L   Total Bilirubin 0.4 0.3 - 1.2 mg/dL   GFR calc non Af Amer >60 >60 mL/min   GFR calc Af Amer >60 >60 mL/min    Comment: (NOTE) The eGFR has been calculated using the CKD EPI equation. This calculation has not been validated in all clinical situations. eGFR's persistently <60 mL/min signify possible Chronic Kidney Disease.    Anion gap 8 5 - 15  Lactic acid, plasma     Status: None   Collection Time: 07/14/16  7:46 PM  Result Value Ref Range   Lactic Acid, Venous 1.3 0.5 - 1.9 mmol/L  Lipase, blood     Status: None   Collection Time: 07/14/16  7:47 PM  Result Value Ref Range   Lipase 13 11 - 51 U/L  Troponin I     Status: None   Collection Time: 07/14/16  7:47 PM  Result Value Ref Range   Troponin I <0.03 <0.03 ng/mL  POC occult blood, ED     Status: Abnormal   Collection Time: 07/14/16  7:54 PM  Result Value Ref Range   Fecal Occult Bld POSITIVE (A) NEGATIVE  Basic metabolic panel     Status: Abnormal   Collection Time: 07/15/16  4:48 AM  Result Value Ref Range   Sodium 140 135 - 145 mmol/L   Potassium 3.7 3.5 - 5.1 mmol/L   Chloride 109 101 - 111 mmol/L   CO2 24 22 - 32 mmol/L   Glucose, Bld 96 65 - 99 mg/dL   BUN 12 6 - 20 mg/dL   Creatinine, Ser 0.67 0.44 - 1.00 mg/dL     Calcium 7.8 (L) 8.9 - 10.3 mg/dL   GFR calc non Af Amer >60 >60 mL/min   GFR calc Af Amer >60 >60 mL/min    Comment: (NOTE) The eGFR has been calculated using the CKD EPI equation. This calculation has not been validated in all clinical situations. eGFR's persistently <60 mL/min signify possible Chronic Kidney Disease.    Anion gap 7 5 - 15  CBC     Status: Abnormal   Collection Time: 07/15/16  4:48 AM  Result Value Ref Range   WBC 9.7 4.0 - 10.5 K/uL   RBC 3.61 (L) 3.87 - 5.11 MIL/uL   Hemoglobin 11.8 (L)  12.0 - 15.0 g/dL   HCT 36.0 36.0 - 46.0 %   MCV 99.7 78.0 - 100.0 fL   MCH 32.7 26.0 - 34.0 pg   MCHC 32.8 30.0 - 36.0 g/dL   RDW 12.0 11.5 - 15.5 %   Platelets 382 150 - 400 K/uL    Studies/Results:   BRAIN MRA FINDINGS: Atherosclerotic changes are present within the cavernous internal carotid arteries without a significant stenosis relative to the more distal vessel. The A1 and M1 segments are normal. The anterior communicating artery is patent. MCA bifurcations are intact. Mild distal small vessel disease is present without a significant proximal stenosis or occlusion within the ACA or MCA branch vessels.  The right vertebral artery is the dominant vessel. The left vertebral artery is not visualized. The basilar artery is small and essentially terminates at the superior cerebellar arteries. Fetal type posterior cerebral arteries are present bilaterally. There is some attenuation of distal PCA branch vessels.  IMPRESSION: 1. Mild atherosclerotic changes within the cavernous internal carotid arteries bilaterally without a significant stenosis. 2. The left vertebral artery is not visualized. This may be a hypoplastic vessel or represent occlusion. 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       BRAIN MRI FINDINGS: Brain: The diffusion-weighted images demonstrate no acute or subacute infarction. Mild periventricular T2 changes are evident bilaterally. T2 signal change volume loss in the left frontal operculum is compatible with a remote infarct. No acute hemorrhage or mass lesion is present. A remote lacunar infarct is evident within the left thalamus. Remote lacunar infarcts are present within the right cerebellum. The brainstem is within normal limits.  Vascular: Flow is present in the major intracranial arteries.  Skull and upper cervical spine: The skullbase is within normal limits. Midline intracranial sagittal  structures are normal. Disc disease is again noted at C3-4. Marrow signal is normal.  Sinuses/Orbits: The paranasal sinuses and mastoid air cells are clear. The globes and orbits are within normal limits.  IMPRESSION: 1. No acute intracranial abnormality. 2. Mild age advanced white matter disease. The finding is nonspecific but can be seen in the setting of chronic microvascular ischemia, a demyelinating process such as multiple sclerosis, vasculitis, complicated migraine headaches, or as the sequelae of a prior infectious or inflammatory process. 3. 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 4. Remote lacunar infarcts of the left thalamus and right cerebellum.         Dmitry Macomber A. Merlene Laughter, M.D.  Diplomate, Tax adviser of Psychiatry and Neurology ( Neurology). 07/15/2016, 10:44 AM

## 2016-07-15 NOTE — Evaluation (Signed)
Physical Therapy Evaluation Patient Details Name: Alexis Barnes MRN: [id number] DOB: Dec 24, 1945 Today's Date: 07/15/2016   History of Present Illness  70 y.o. 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  She reports that she hasn't felt good all week, kind of weak.  This morning when she tried to get up, everything started spinning, wasn't able to get up.  Was finally able to make it to the bathroom.  Again, about 2pm, more spinning.  Would last a few minutes and resolve.  Happened again at 3:45.  Has h/o vertigo.  Clinical Impression  Pt was received in her bed with her daughter present in the room. She was A&O x3 and willing to participate in therapist evaluation. Overall she demonstrates BUE/BLE strength that is WNL. She is independent with all transfers and bed mobility as well. She was hesitant to ambulate due to her inability to tell when her dizziness is going to hit her, but she was able to ambulate 100+ ft with no more than supervision assistance. The neurologist came in during the evaluation and performed the Dix-Hallpike test which reproduced nystagmus and resolved within 1-2 minutes. Therapist educated the pt concerning the anatomy of the inner ear and the purpose for the test. She appeared to have improved understanding by the end of the session. From the PT standpoint, she is safe to return home with family assistance as needed. Pt was hesitant to allow therapist to perform any canalith repositioning maneuvers at this time, but it is likely that she would benefit from further evaluation in an OPPT setting after discharge.   Follow Up Recommendations Outpatient PT;Other (comment) (Pt could benefit from vestibular evaluation)    Equipment Recommendations  None recommended by PT    Recommendations for Other Services       Precautions /  Restrictions Restrictions Weight Bearing Restrictions: No      Mobility  Bed Mobility Overal bed mobility: Independent                Transfers Overall transfer level: Independent               General transfer comment: No report of dizziness, unsteadiness   Ambulation/Gait Ambulation/Gait assistance: Supervision Ambulation Distance (Feet): 100 Feet Assistive device: None Gait Pattern/deviations: WFL(Within Functional Limits)   Gait velocity interpretation: <1.8 ft/sec, indicative of risk for recurrent falls General Gait Details: Pt able to perform head turns during ambulation without increase in dizziness or demonstrating unsteadiness  Stairs            Wheelchair Mobility    Modified Rankin (Stroke Patients Only)       Balance   Sitting-balance support: Bilateral upper extremity supported;Feet supported Sitting balance-Leahy Scale: Normal     Standing balance support: No upper extremity supported Standing balance-Leahy Scale: Good                               Pertinent Vitals/Pain Pain Assessment: No/denies pain    Home Living Family/patient expects to be discharged to:: Private residence Living Arrangements: Children;Spouse/significant other Available Help at Discharge: Family Type of Home: House Home Access: Level entry     Home Layout: One level Home Equipment: Environmental consultant - 2 wheels;Shower seat      Prior Function Level of Independence: Independent         Comments: Currently supervising  her husband, has an aide come 3x/week to assist with his bathing     Hand Dominance        Extremity/Trunk Assessment   Upper Extremity Assessment Upper Extremity Assessment: Overall WFL for tasks assessed    Lower Extremity Assessment Lower Extremity Assessment: Overall WFL for tasks assessed (Light touch intact BUE/BLE)       Communication   Communication: No difficulties;Other (comment) (Pt reports she is speaking  slower than normal)  Cognition Arousal/Alertness: Awake/alert Behavior During Therapy: WFL for tasks assessed/performed Overall Cognitive Status: Within Functional Limits for tasks assessed                      General Comments      Exercises     Assessment/Plan    PT Assessment All further PT needs can be met in the next venue of care  PT Problem List Decreased balance          PT Treatment Interventions      PT Goals (Current goals can be found in the Care Plan section)  Acute Rehab PT Goals Patient Stated Goal: get better PT Goal Formulation: With patient Time For Goal Achievement: 07/29/16 Potential to Achieve Goals: Good    Frequency     Barriers to discharge        Co-evaluation               End of Session Equipment Utilized During Treatment: Gait belt Activity Tolerance: Patient tolerated treatment well Patient left: in bed;with call bell/phone within reach;with nursing/sitter in room;with family/visitor present Nurse Communication: Mobility status    Functional Assessment Tool Used: Clinical judgement based on assessment of strength and mobility Functional Limitation: Mobility: Walking and moving around Mobility: Walking and Moving Around Current Status [phone]): At least 20 percent but less than 40 percent impaired, limited or restricted Mobility: Walking and Moving Around Goal Status [phone]): At least 1 percent but less than 20 percent impaired, limited or restricted    Time: 1100-1130 PT Time Calculation (min) (ACUTE ONLY): 30 min   Charges:   PT Evaluation $PT Eval Low Complexity: 1 Procedure PT Treatments $Therapeutic Activity: 8-22 mins $Self Care/Home Management: 8-22   PT G Codes:   PT G-Codes **NOT FOR INPATIENT CLASS** Functional Assessment Tool Used: Clinical judgement based on assessment of strength and mobility Functional Limitation: Mobility: Walking and moving around Mobility: Walking and Moving Around Current Status  (T5974): At least 20 percent but less than 40 percent impaired, limited or restricted Mobility: Walking and Moving Around Goal Status [phone]): At least 1 percent but less than 20 percent impaired, limited or restricted    3:53 PM,07/15/16 Elly Modena PT, [facility] [phone]

## 2016-07-15 NOTE — Care Management Obs Status (Signed)
MEDICARE OBSERVATION STATUS NOTIFICATION   Patient Details  Name: Alexis KinsmanCarolyn H Barnes MRN: [id number] Date of Birth: 02/11/1946   Medicare Observation Status Notification Given:  Yes    Aylissa Heinemann, Chrystine OilerSharley Diane, RN 07/15/2016, 9:37 AM

## 2016-07-16 MED ORDER — MECLIZINE HCL 25 MG PO TABS: 25.0000 mg | ORAL_TABLET | Freq: Three times a day (TID) | ORAL | 0 refills | Status: DC | PRN

## 2016-07-16 MED ORDER — ASPIRIN 325 MG PO TABS: 325.0000 mg | ORAL_TABLET | Freq: Every day | ORAL | 0 refills | Status: DC

## 2016-07-16 MED ORDER — EZETIMIBE 10 MG PO TABS: 10.0000 mg | ORAL_TABLET | Freq: Every day | ORAL | 0 refills | Status: AC

## 2016-07-16 MED ORDER — METRONIDAZOLE 500 MG PO TABS: 500.0000 mg | ORAL_TABLET | Freq: Three times a day (TID) | ORAL | 0 refills | Status: AC

## 2016-07-16 MED ORDER — CIPROFLOXACIN HCL 500 MG PO TABS: 500.0000 mg | ORAL_TABLET | Freq: Two times a day (BID) | ORAL | 0 refills | Status: AC

## 2016-07-16 NOTE — Discharge Summary (Signed)
Physician Discharge Summary  Alexis Barnes JYN:[id number] DOB: Jun 24, 1946 DOA: 07/14/2016  PCP: Valla Leaver, MD  Admit date: 07/14/2016 Discharge date: 07/16/2016  Time spent: > 35  minutes  Recommendations for Outpatient Follow-up:  1. Benign positional vertigo as cause of vertigo. Neurology saw and recommended maneuvers. Consider outpatient referral to Physical therapy should problem persist   Discharge Diagnoses:  Principal Problem:   Vertigo Active Problems:   GERD (gastroesophageal reflux disease)   Colitis   Heme positive stool   Postherpetic neuralgia   Stroke (cerebrum) [facility])   Discharge Condition: stable  Diet recommendation: Low sodium heart healthy  Filed Weights   07/14/16 1750 07/15/16 0037  Weight: 83.9 kg (185 lb) 88.1 kg (194 lb 3.2 oz)    History of present illness:  Patient is a 70 year old with history of diverticulitis, postherpetic neuralgia with chronic lymphangitis the left axilla, diverticulitis, hyperlipidemia, hypertension, and fibromyalgia. Who presented to the hospital secondary to vertigo. Patient also has been complaining of abdominal discomfort CT of abdomen performed and suspicious for acute colitis.  Hospital Course:  Vertigo - Neurology consulted who recommended positional maneuvering please refer to EMR. Will also discharge on meclizine. Patient has been instructed to follow-up with her primary care physician and at that point consideration can be made for referral to physical therapy should symptoms persist.  Acute colitis - Patient will be discharged on 9 days of oral antibiotics. Prescriptions provided on discharge  Hyperlipidemia -Patient requested prescription for study. Provided on discharge  For other known medical conditions listed above continue medication regimen listed below   Procedures:  MRI of brain CT of head. CT of abdomen and pelvis  Consultations:  Neurology  Discharge Exam: Vitals:   07/16/16 0629 07/16/16 0717  BP: (!) 130/48 (!) 131/51  Pulse: 80 85  Resp: 17 18  Temp: 97.9 F (36.6 C)     General: Pt in nad, alert and awake Cardiovascular: rrr, no rubs Respiratory: no increased wob, no wheezes  Discharge Instructions   Discharge Instructions    Ambulatory referral to Physical Therapy    Complete by:  As directed    OP PT, (vestibular rehab)   Call MD for:  severe uncontrolled pain    Complete by:  As directed    Call MD for:  temperature >100.4    Complete by:  As directed    Diet - low sodium heart healthy    Complete by:  As directed    Discharge instructions    Complete by:  As directed    Please follow up with your primary care physician in 1-2 weeks or sooner should any new concerns arise.   Increase activity slowly    Complete by:  As directed      Current Discharge Medication List    START taking these medications   Details  aspirin 325 MG tablet Take 1 tablet (325 mg total) by mouth daily. Qty: 30 tablet, Refills: 0    ciprofloxacin (CIPRO) 500 MG tablet Take 1 tablet (500 mg total) by mouth 2 (two) times daily. Qty: 18 tablet, Refills: 0    meclizine (ANTIVERT) 25 MG tablet Take 1 tablet (25 mg total) by mouth 3 (three) times daily as needed for dizziness. Qty: 30 tablet, Refills: 0    metroNIDAZOLE (FLAGYL) 500 MG tablet Take 1 tablet (500 mg total) by mouth every 8 (eight) hours. Qty: 27 tablet, Refills: 0      CONTINUE these medications which have CHANGED   Details  ezetimibe (  ZETIA) 10 MG tablet Take 1 tablet (10 mg total) by mouth daily. Qty: 30 tablet, Refills: 0      CONTINUE these medications which have NOT CHANGED   Details  Ascorbic Acid (VITAMIN C) 100 MG tablet Take 100 mg by mouth daily.    B Complex Vitamins (VITAMIN B COMPLEX PO) Take 1 tablet by mouth 2 (two) times daily.    pregabalin (LYRICA) 25 MG capsule Take 25 mg by mouth 2 (two) times daily.    Vitamin D, Cholecalciferol, 1000 UNITS TABS Take 1  tablet by mouth 3 (three) times daily.      STOP taking these medications     aspirin 81 MG chewable tablet        Allergies  Allergen Reactions  . Statins Other (See Comments)    Muscle aches       The results of significant diagnostics from this hospitalization (including imaging, microbiology, ancillary and laboratory) are listed below for reference.    Significant Diagnostic Studies: Ct Head Wo Contrast  Result Date: 07/14/2016 CLINICAL DATA:  Initial evaluation for acute dizziness, standing. EXAM: CT HEAD WITHOUT CONTRAST TECHNIQUE: Contiguous axial images were obtained from the base of the skull through the vertex without intravenous contrast. COMPARISON:  None available. FINDINGS: Brain: Cerebral volume within normal limits for age. The superimposed mild chronic microvascular ischemic changes present. No evidence for acute intracranial hemorrhage. No evidence for acute large vessel territory infarct. No mass lesion, midline shift or mass effect. No hydrocephalus. No extra-axial fluid collection. Vascular: No hyperdense vessel. Skull: Scalp soft tissues within normal limits.  Calvarium intact. Sinuses/Orbits: Globes and orbital soft tissues within normal limits. Paranasal sinuses are clear. No mastoid effusion. IMPRESSION: 1. No acute intracranial process identified. 2. Mild chronic small vessel ischemic disease. Electronically Signed   By: Rise Mu M.D.   On: 07/14/2016 22:11   Mr Shirlee Latch ZO Contrast  Result Date: 07/15/2016 CLINICAL DATA:  Acute onset of dizziness today. EXAM: MRA HEAD WITHOUT CONTRAST TECHNIQUE: Angiographic images of the Circle of Willis were obtained using MRA technique without intravenous contrast. COMPARISON:  None. FINDINGS: Atherosclerotic changes are present within the cavernous internal carotid arteries without a significant stenosis relative to the more distal vessel. The A1 and M1 segments are normal. The anterior communicating artery is  patent. MCA bifurcations are intact. Mild distal small vessel disease is present without a significant proximal stenosis or occlusion within the ACA or MCA branch vessels. The right vertebral artery is the dominant vessel. The left vertebral artery is not visualized. The basilar artery is small and essentially terminates at the superior cerebellar arteries. Fetal type posterior cerebral arteries are present bilaterally. There is some attenuation of distal PCA branch vessels. IMPRESSION: 1. Mild atherosclerotic changes within the cavernous internal carotid arteries bilaterally without a significant stenosis. 2. The left vertebral artery is not visualized. This may be a hypoplastic vessel or represent occlusion. 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 Electronically Signed   By: Marin Roberts M.D.   On: 07/15/2016 10:35   Mr Brain Wo Contrast  Result Date: 07/15/2016 CLINICAL DATA:  New onset of dizziness today. Symptoms began after episode of diarrhea. EXAM: MRI HEAD WITHOUT CONTRAST TECHNIQUE: Multiplanar, multiecho pulse sequences of the brain and surrounding structures were obtained without intravenous contrast. COMPARISON:  CT head without contrast from the same day. FINDINGS: Brain: The diffusion-weighted images demonstrate no acute or subacute infarction. Mild periventricular T2 changes are evident bilaterally. T2  signal change volume loss in the left frontal operculum is compatible with a remote infarct. No acute hemorrhage or mass lesion is present. A remote lacunar infarct is evident within the left thalamus. Remote lacunar infarcts are present within the right cerebellum. The brainstem is within normal limits. Vascular: Flow is present in the major intracranial arteries. Skull and upper cervical spine: The skullbase is within normal limits. Midline intracranial sagittal structures are normal. Disc disease is again noted at C3-4. Marrow signal is  normal. Sinuses/Orbits: The paranasal sinuses and mastoid air cells are clear. The globes and orbits are within normal limits. IMPRESSION: 1. No acute intracranial abnormality. 2. Mild age advanced white matter disease. The finding is nonspecific but can be seen in the setting of chronic microvascular ischemia, a demyelinating process such as multiple sclerosis, vasculitis, complicated migraine headaches, or as the sequelae of a prior infectious or inflammatory process. 3. 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 4. Remote lacunar infarcts of the left thalamus and right cerebellum. Electronically Signed   By: Marin Robertshristopher  Mattern M.D.   On: 07/15/2016 10:33   Ct Abdomen Pelvis W Contrast  Result Date: 07/14/2016 CLINICAL DATA:  Initial evaluation for acute diarrhea, black stools. History of diverticulitis. EXAM: CT ABDOMEN AND PELVIS WITH CONTRAST TECHNIQUE: Multidetector CT imaging of the abdomen and pelvis was performed using the standard protocol following bolus administration of intravenous contrast. CONTRAST:  100mL ISOVUE-300 IOPAMIDOL (ISOVUE-300) INJECTION 61% COMPARISON:  None. FINDINGS: Lower chest: Mild subsegmental atelectasis seen dependently within the right lung base. Visualized lungs are otherwise clear. Hepatobiliary: Diffuse hypoattenuation of the liver compatible steatosis. Liver otherwise unremarkable. Gallbladder within normal limits. No biliary dilatation. Pancreas: Fatty atrophy of the pancreas noted. Pancreas otherwise unremarkable without acute inflammatory changes. Spleen: Spleen within normal limits. Adrenals/Urinary Tract: Adrenal glands are normal. Kidneys equal in size with symmetric enhancement. Subcentimeter hypodensities within the kidneys bilaterally too small characterize, but statistically likely reflects small cyst. No nephrolithiasis, hydronephrosis, or focal enhancing renal mass. Ureters of normal caliber without acute abnormality. Bladder within normal  limits. Stomach/Bowel: Small hiatal hernia noted. Stomach otherwise unremarkable. No evidence for bowel obstruction. No acute inflammatory changes seen about the small bowel. Appendix within normal limits. Ascending and transverse colon within normal limits. The descending and sigmoid colon are diffusely decompressed. Mild circumferential wall thickening with subtle hazy stranding suspicious for possible acute colitis given the patient's symptoms. No evidence for associated obstruction or other complication. Vascular/Lymphatic: Normal intravascular enhancement seen throughout the intra-abdominal aorta and its branch vessels. 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 No aneurysm. No adenopathy. Reproductive: Uterus within normal limits. Ovaries appear to be absent. Other: No free air or fluid. Musculoskeletal: No acute osseous abnormality. No worrisome lytic or blastic osseous lesions. IMPRESSION: 1. Mild circumferential wall thickening with hazy fat stranding about the descending and proximal sigmoid colon, suspicious for acute colitis given the patient's symptoms. No evidence for obstruction or other complication identified. 2. No other acute intra-abdominal or pelvic process. 3. Hepatic steatosis. Electronically Signed   By: Rise MuBenjamin  McClintock M.D.   On: 07/14/2016 22:19    Microbiology: No results found for this or any previous visit (from the past 240 hour(s)).   Labs: Basic Metabolic Panel:  Recent Labs Lab 07/14/16 1946 07/15/16 0448  NA 137 140  K 3.7 3.7  CL 105 109  CO2 24 24  GLUCOSE 101* 96  BUN 16 12  CREATININE 0.90 0.67  CALCIUM 8.8* 7.8*   Liver Function Tests:  Recent Labs Lab 07/14/16 1946  AST 21  ALT 19  ALKPHOS 48  BILITOT 0.4  PROT 7.2  ALBUMIN 3.9    Recent Labs Lab 07/14/16 1947  LIPASE 13   No results for input(s): AMMONIA in the last 168 hours. CBC:  Recent Labs Lab 07/14/16 1946 07/15/16 0448  WBC 11.8* 9.7  NEUTROABS 6.3  --    HGB 14.2 11.8*  HCT 42.1 36.0  MCV 98.4 99.7  PLT 417* 382   Cardiac Enzymes:  Recent Labs Lab 07/14/16 1947  TROPONINI <0.03   BNP: BNP (last 3 results) No results for input(s): BNP in the last 8760 hours.  ProBNP (last 3 results) No results for input(s): PROBNP in the last 8760 hours.  CBG: No results for input(s): GLUCAP in the last 168 hours.   Signed:  Penny PiaVEGA, Kimbra Marcelino MD.  [facility] 07/16/2016, 10:45 AM

## 2016-07-16 NOTE — Progress Notes (Signed)
Patient discharged home with belongings, prescriptions, ad IV removed and site intact.

## 2017-05-01 ENCOUNTER — Other Ambulatory Visit: Payer: Self-pay

## 2017-05-01 DIAGNOSIS — I6523 Occlusion and stenosis of bilateral carotid arteries: Secondary | ICD-10-CM

## 2017-05-25 ENCOUNTER — Ambulatory Visit (INDEPENDENT_AMBULATORY_CARE_PROVIDER_SITE_OTHER): Payer: Medicare Other

## 2017-05-25 DIAGNOSIS — I6523 Occlusion and stenosis of bilateral carotid arteries: Secondary | ICD-10-CM

## 2017-05-25 LAB — VAS US CAROTID
LCCADDIAS: 21 cm/s
LCCAPDIAS: 0 cm/s
LCCAPSYS: 140 cm/s
LEFT ECA DIAS: -13 cm/s
LEFT VERTEBRAL DIAS: -8 cm/s
LICADSYS: -124 cm/s
Left CCA dist sys: 97 cm/s
Left ICA dist dias: -35 cm/s
Left ICA prox dias: -14 cm/s
Left ICA prox sys: -52 cm/s
RCCADSYS: 138 cm/s
RIGHT ECA DIAS: 15 cm/s
RIGHT VERTEBRAL DIAS: -12 cm/s
Right CCA prox dias: 15 cm/s
Right CCA prox sys: 167 cm/s

## 2017-05-26 ENCOUNTER — Telehealth: Payer: Self-pay

## 2017-05-26 NOTE — Telephone Encounter (Signed)
-----   Message from Norva PavlovKailey Ailin Rochford, LPN sent at 78/2/956211/08/2016  8:28 AM EDT -----   ----- Message ----- From: Jonelle SidleMcDowell, Samuel G, MD Sent: 05/25/2017   4:32 PM To: Eustace MooreLydia M Anderson, LPN  Results reviewed.  Please let her know that this study does not indicate any degree of significant stenosis of either carotid artery.  Would suggest basic risk factor modification and follow-up with PCP at this point. A copy of this test should be forwarded to Valla LeaverEggleston-Clark, [place], MD.

## 2017-05-26 NOTE — Telephone Encounter (Signed)
Patient notified. Routed to PCP 

## 2017-09-03 IMAGING — MR MR HEAD W/O CM
8 of 10 series · 40 of 48 positions shown · non-contrast
Comparison: CT head without contrast from the same day.

CLINICAL DATA: New onset of dizziness today. Symptoms began after
episode of diarrhea.

EXAM:
MRI HEAD WITHOUT CONTRAST
TECHNIQUE: Multiplanar, multiecho pulse sequences of the brain and surrounding
structures were obtained without intravenous contrast.

[Series 3: DWI · axial · 3.0mm · 0.74mm/px · z∈[-45,+114]mm · 7 of 54 slices shown (1 of 4)]
[im 1/54]
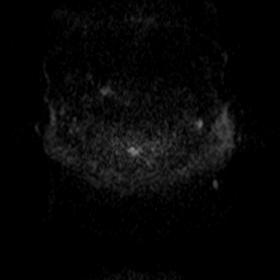
[im 9/54]
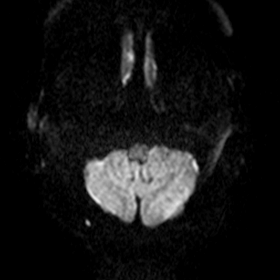
[im 18/54]
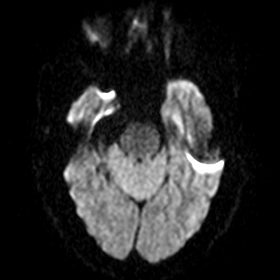
[im 27/54]
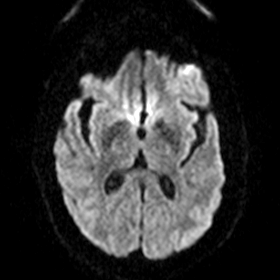
[im 36/54]
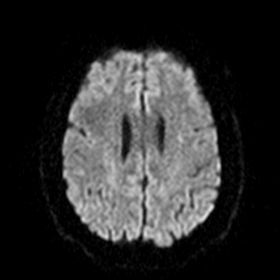
[im 45/54]
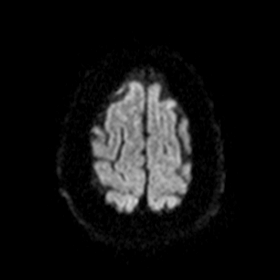
[im 54/54]
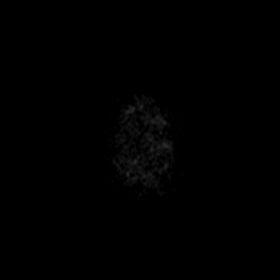

[Series 4: DWI · axial · 3.0mm · 0.82mm/px · z∈[-46,+115]mm · 7 of 55 slices shown (2 of 4)]
[im 1/55]
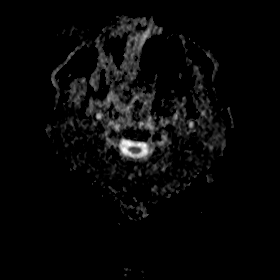
[im 10/55]
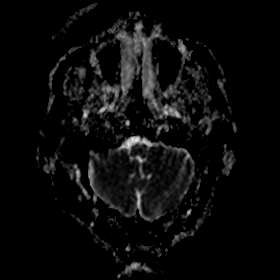
[im 19/55]
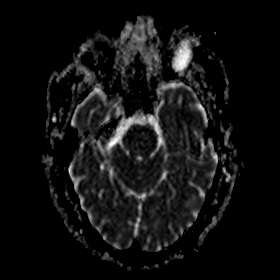
[im 28/55]
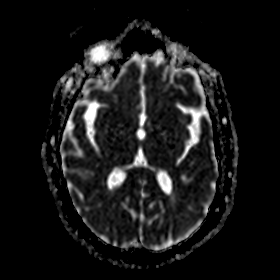
[im 37/55]
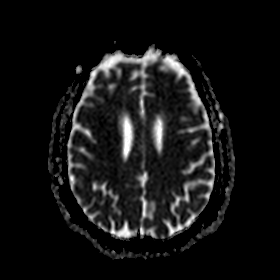
[im 46/55]
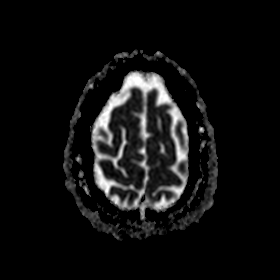
[im 55/55]
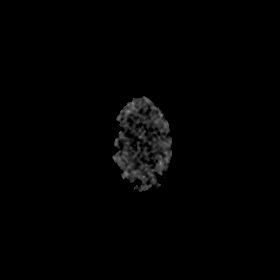

[Series 5: DWI · coronal · 5.0mm · 0.50mm/px · 4 of 34 slices shown (3 of 4)]
[im 1/34]
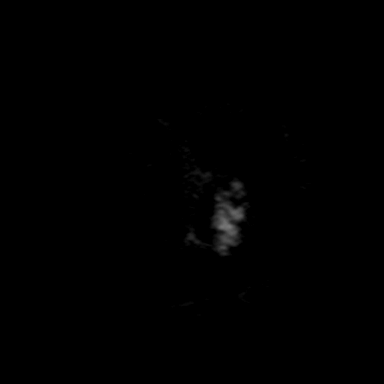
[im 12/34]
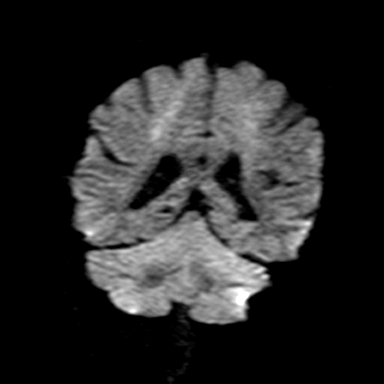
[im 23/34]
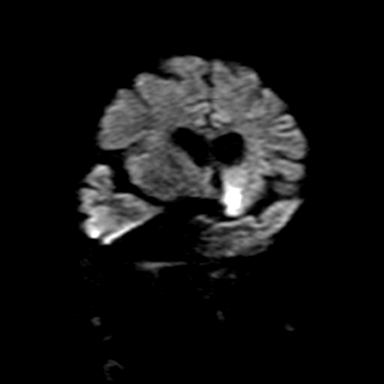
[im 34/34]
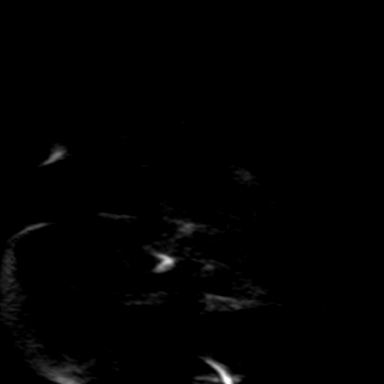

[Series 6: DWI · coronal · 5.0mm · 0.53mm/px · 4 of 34 slices shown (4 of 4)]
[im 1/34]
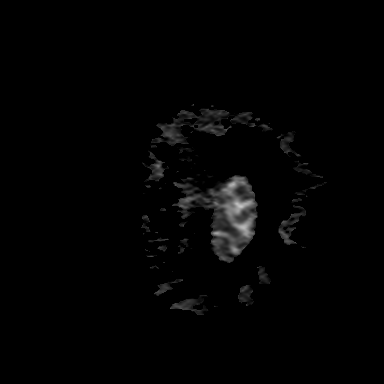
[im 12/34]
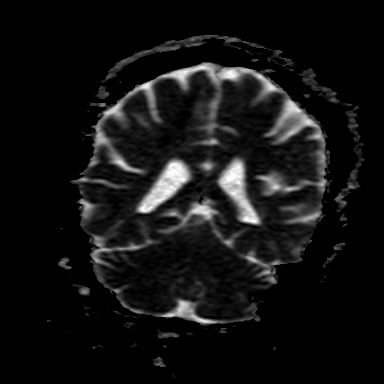
[im 23/34]
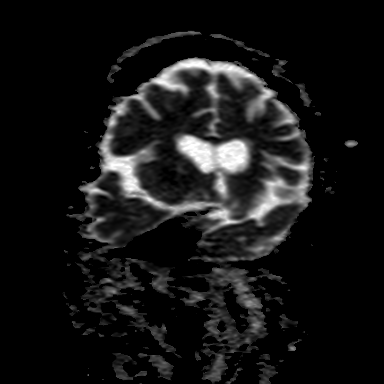
[im 34/34]
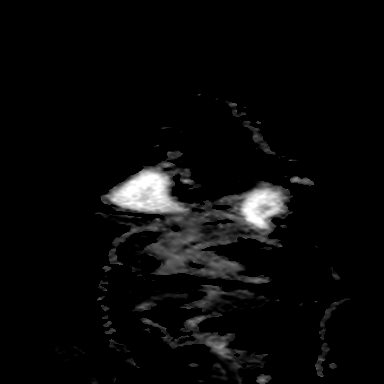

[Series 7: T2 · axial · 5.0mm · 0.65mm/px · z∈[-38,+104]mm · 3 of 23 slices shown (1 of 2)]
[im 1/23]
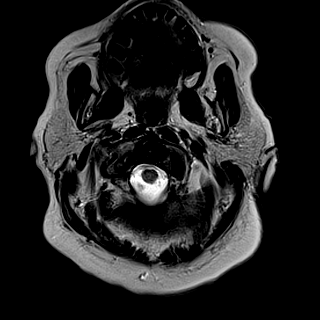
[im 12/23]
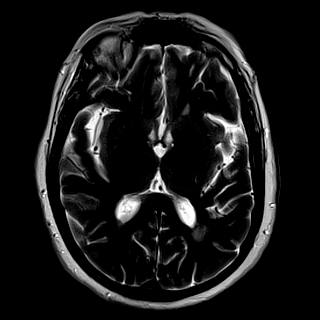
[im 23/23]
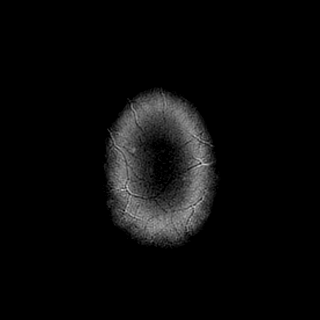

[Series 8: FLAIR · axial · 5.0mm · 0.82mm/px · z∈[-38,+104]mm · 3 of 23 slices shown]
[im 1/23]
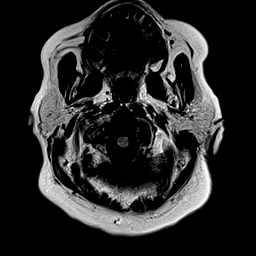
[im 12/23]
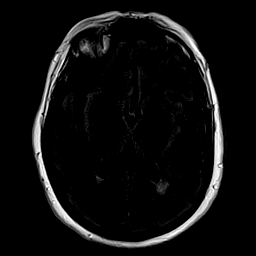
[im 23/23]
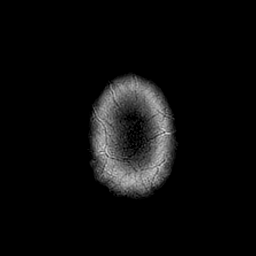

[Series 9: T1 · axial · 2.0mm · 0.42mm/px · z∈[-52,+109]mm · 8 of 82 slices shown]
[im 1/82]
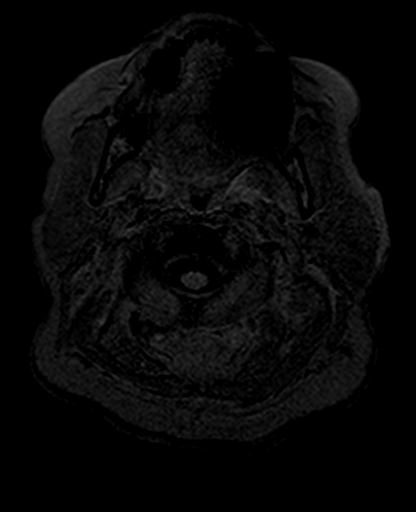
[im 17/82]
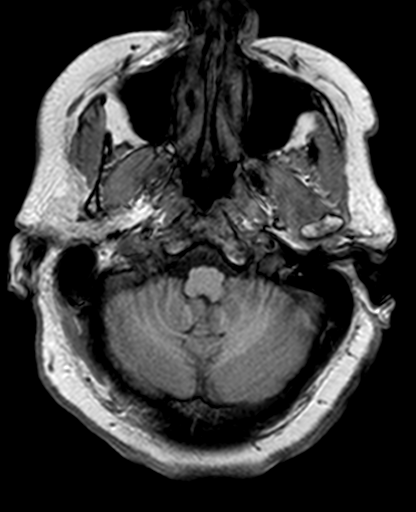
[im 25/82]
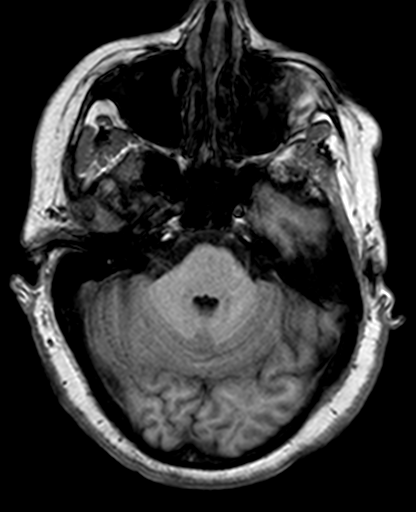
[im 33/82]
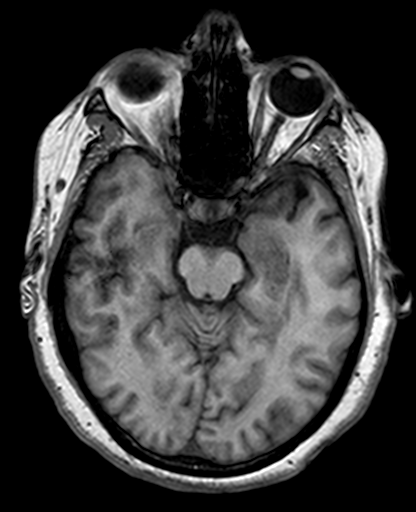
[im 49/82]
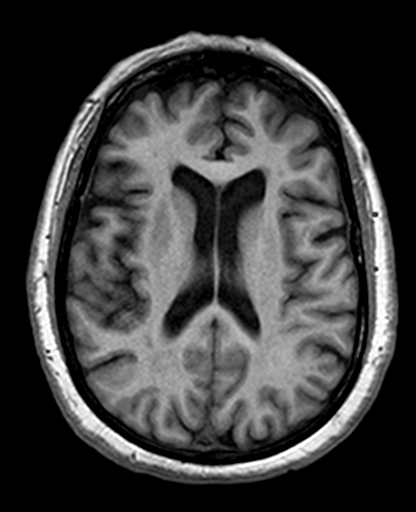
[im 57/82]
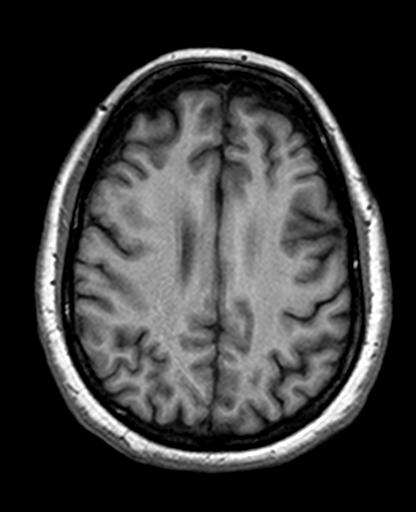
[im 65/82]
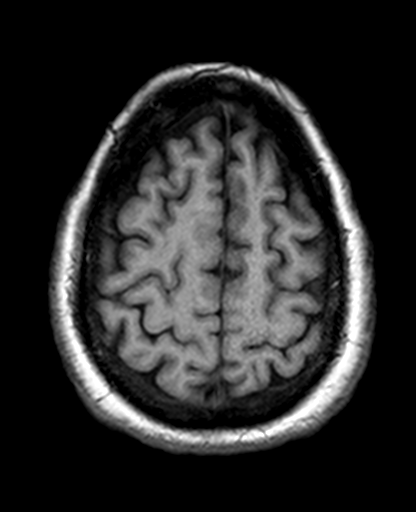
[im 82/82]
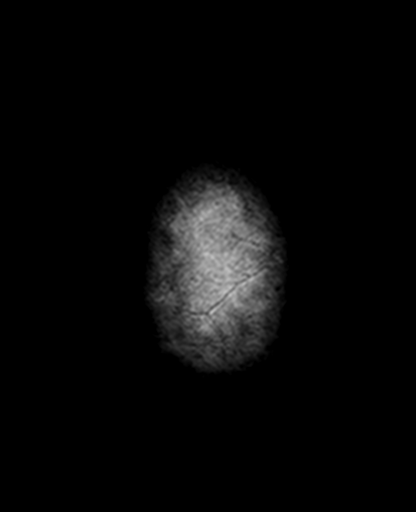

[Series 11: T2 · coronal · 5.0mm · 0.59mm/px · 4 of 28 slices shown (2 of 2)]
[im 1/28]
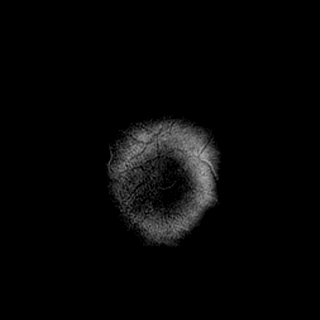
[im 10/28]
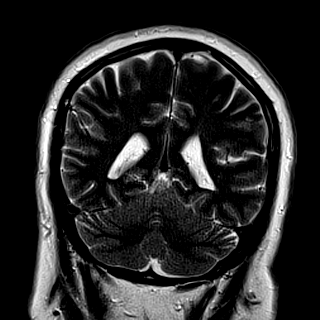
[im 19/28]
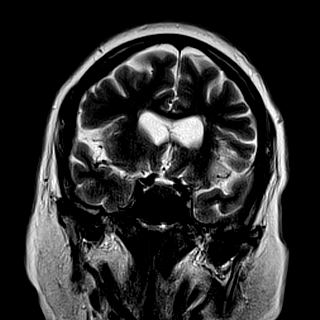
[im 28/28]
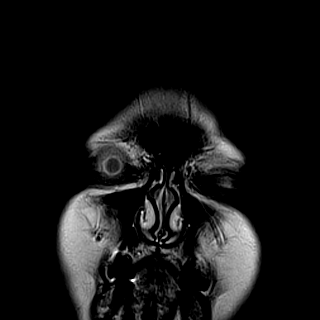

[40 of 48 positions shown; findings below may reference images not displayed]

FINDINGS: Brain: The diffusion-weighted images demonstrate no acute or
subacute infarction. Mild periventricular T2 changes are evident
bilaterally. T2 signal change volume loss in the left frontal
operculum is compatible with a remote infarct. No acute hemorrhage
or mass lesion is present. A remote lacunar infarct is evident
within the left thalamus. Remote lacunar infarcts are present within
the right cerebellum. The brainstem is within normal limits.

Vascular: Flow is present in the major intracranial arteries.

Skull and upper cervical spine: The skullbase is within normal
limits. Midline intracranial sagittal structures are normal. Disc
disease is again noted at C3-4. Marrow signal is normal.

Sinuses/Orbits: The paranasal sinuses and mastoid air cells are
clear. The globes and orbits are within normal limits.
IMPRESSION: 1. No acute intracranial abnormality.
2. Mild age advanced white matter disease. The finding is
nonspecific but can be seen in the setting of chronic microvascular
ischemia, a demyelinating process such as multiple sclerosis,
vasculitis, complicated migraine headaches, or as the sequelae of a
prior infectious or inflammatory process.
3. Remote cortical infarct in the left frontal operculum.
4. Remote lacunar infarcts of the left thalamus and right
cerebellum.

## 2022-07-12 ENCOUNTER — Encounter (INDEPENDENT_AMBULATORY_CARE_PROVIDER_SITE_OTHER): Payer: Self-pay | Admitting: *Deleted

## 2022-12-26 ENCOUNTER — Encounter (INDEPENDENT_AMBULATORY_CARE_PROVIDER_SITE_OTHER): Payer: Self-pay | Admitting: *Deleted

## 2023-04-06 NOTE — Progress Notes (Addendum)
COVID Vaccine Completed: yes  Date of COVID positive in last 90 days: August 14th   PCP - Mitzi Hansen, MD Cardiologist - Rennis Harding, MD LOV 03/07/23  Medical clearance by Joanna Hews, MD 03/28/23 on chart  Chest x-ray - n/a EKG - 02/06/23 [facility] Stress Test - 03/07/23 [facility] ECHO - 03/07/23 [facility] Cardiac Cath - n/a Pacemaker/ICD device last checked: n/a Spinal Cord Stimulator: n/a  Bowel Prep - no  Sleep Study - n/a CPAP -   Fasting Blood Sugar - n/a Checks Blood Sugar _____ times a day  Last dose of GLP1 agonist-  N/A GLP1 instructions:  N/A   Last dose of SGLT-2 inhibitors-  N/A SGLT-2 instructions: N/A   Blood Thinner Instructions:  Time Aspirin Instructions: ASA 81 do not take day of Last Dose:  Activity level: Can perform activities of daily living without stopping and without symptoms of chest pain or shortness of breath. No stairs or walking due to hip  Anesthesia review: CP, CVA, atrial flutter, HTN, CHF, cardiac clearance?  Patient denies shortness of breath, fever, cough and chest pain at PAT appointment  Patient verbalized understanding of instructions that were given to them at the PAT appointment. Patient was also instructed that they will need to review over the PAT instructions again at home before surgery.

## 2023-04-06 NOTE — Patient Instructions (Addendum)
SURGICAL WAITING ROOM VISITATION  Patients having surgery or a procedure may have no more than 2 support people in the waiting area - these visitors may rotate.    Children under the age of 52 must have an adult with them who is not the patient.  Due to an increase in RSV and influenza rates and associated hospitalizations, children ages 43 and under may not visit patients in [facility] hospitals.  If the patient needs to stay at the hospital during part of their recovery, the visitor guidelines for inpatient rooms apply. Pre-op nurse will coordinate an appropriate time for 1 support person to accompany patient in pre-op.  This support person may not rotate.    Please refer to the [facility] website for the visitor guidelines for Inpatients (after your surgery is over and you are in a regular room).    Your procedure is scheduled on: 04/17/23    Report to [facility] Main Entrance    Report to admitting at 10:10 AM   Call this number if you have problems the morning of surgery [phone]   Do not eat food :After Midnight.   After Midnight you may have the following liquids until 9:40 AM DAY OF SURGERY  Water Non-Citrus Juices (without pulp, NO RED-Apple, White grape, White cranberry) Black Coffee (NO MILK/CREAM OR CREAMERS, sugar ok)  Clear Tea (NO MILK/CREAM OR CREAMERS, sugar ok) regular and decaf                             Plain Jell-O (NO RED)                                           Fruit ices (not with fruit pulp, NO RED)                                     Popsicles (NO RED)                                                               Sports drinks like Gatorade (NO RED)    The day of surgery:  Drink ONE (1) Pre-Surgery Clear Ensure at 9:40 AM the morning of surgery. Drink in one sitting. Do not sip.  This drink was given to you during your hospital  pre-op appointment visit. Nothing else to drink after completing the  Pre-Surgery Clear Ensure.           If you have questions, please contact your surgeon's office.   FOLLOW BOWEL PREP AND ANY ADDITIONAL PRE OP INSTRUCTIONS YOU RECEIVED FROM YOUR SURGEON'S OFFICE!!!     Oral Hygiene is also important to reduce your risk of infection.                                    Remember - BRUSH YOUR TEETH THE MORNING OF SURGERY WITH YOUR REGULAR TOOTHPASTE  DENTURES WILL BE REMOVED PRIOR TO SURGERY PLEASE DO NOT APPLY "Poly grip" OR  ADHESIVES!!!   Stop all vitamins and herbal supplements 7 days before surgery.   Take these medicines the morning of surgery with A SIP OF WATER: Tylenol, Zetia, Pravastatin, Tramadol                               You may not have any metal on your body including hair pins, jewelry, and body piercing             Do not wear make-up, lotions, powders, perfumes, or deodorant  Do not wear nail polish including gel and S&S, artificial/acrylic nails, or any other type of covering on natural nails including finger and toenails. If you have artificial nails, gel coating, etc. that needs to be removed by a nail salon please have this removed prior to surgery or surgery may need to be canceled/ delayed if the surgeon/ anesthesia feels like they are unable to be safely monitored.   Do not shave  48 hours prior to surgery.    Do not bring valuables to the hospital. [facility] IS NOT             RESPONSIBLE   FOR VALUABLES.   Contacts, glasses, dentures or bridgework may not be worn into surgery.   Bring small overnight bag day of surgery.   DO NOT BRING YOUR HOME MEDICATIONS TO THE HOSPITAL. PHARMACY WILL DISPENSE MEDICATIONS LISTED ON YOUR MEDICATION LIST TO YOU DURING YOUR ADMISSION IN THE HOSPITAL!   Special Instructions: Bring a copy of your healthcare power of attorney and living will documents the day of surgery if you haven't scanned them before.              Please read over the following fact sheets you were given: IF YOU HAVE QUESTIONS ABOUT YOUR PRE-OP  INSTRUCTIONS PLEASE CALL [phone] [place]   If you received a COVID test during your pre-op visit  it is requested that you wear a mask when out in public, stay away from anyone that may not be feeling well and notify your surgeon if you develop symptoms. If you test positive for Covid or have been in contact with anyone that has tested positive in the last 10 days please notify you surgeon.      Pre-operative 5 CHG Bath Instructions   You can play a key role in reducing the risk of infection after surgery. Your skin needs to be as free of germs as possible. You can reduce the number of germs on your skin by washing with CHG (chlorhexidine gluconate) soap before surgery. CHG is an antiseptic soap that kills germs and continues to kill germs even after washing.   DO NOT use if you have an allergy to chlorhexidine/CHG or antibacterial soaps. If your skin becomes reddened or irritated, stop using the CHG and notify one of our RNs at [phone].   Please shower with the CHG soap starting 4 days before surgery using the following schedule:     Please keep in mind the following:  DO NOT shave, including legs and underarms, starting the day of your first shower.   You may shave your face at any point before/day of surgery.  Place clean sheets on your bed the day you start using CHG soap. Use a clean washcloth (not used since being washed) for each shower. DO NOT sleep with pets once you start using the CHG.   CHG Shower Instructions:  If you choose to  wash your hair and private area, wash first with your normal shampoo/soap.  After you use shampoo/soap, rinse your hair and body thoroughly to remove shampoo/soap residue.  Turn the water OFF and apply about 3 tablespoons (45 ml) of CHG soap to a CLEAN washcloth.  Apply CHG soap ONLY FROM YOUR NECK DOWN TO YOUR TOES (washing for 3-5 minutes)  DO NOT use CHG soap on face, private areas, open wounds, or sores.  Pay special attention to the  area where your surgery is being performed.  If you are having back surgery, having someone wash your back for you may be helpful. Wait 2 minutes after CHG soap is applied, then you may rinse off the CHG soap.  Pat dry with a clean towel  Put on clean clothes/pajamas   If you choose to wear lotion, please use ONLY the CHG-compatible lotions on the back of this paper.     Additional instructions for the day of surgery: DO NOT APPLY any lotions, deodorants, cologne, or perfumes.   Put on clean/comfortable clothes.  Brush your teeth.  Ask your nurse before applying any prescription medications to the skin.      CHG Compatible Lotions   Aveeno Moisturizing lotion  Cetaphil Moisturizing Cream  Cetaphil Moisturizing Lotion  Clairol Herbal Essence Moisturizing Lotion, Dry Skin  Clairol Herbal Essence Moisturizing Lotion, Extra Dry Skin  Clairol Herbal Essence Moisturizing Lotion, Normal Skin  Curel Age Defying Therapeutic Moisturizing Lotion with Alpha Hydroxy  Curel Extreme Care Body Lotion  Curel Soothing Hands Moisturizing Hand Lotion  Curel Therapeutic Moisturizing Cream, Fragrance-Free  Curel Therapeutic Moisturizing Lotion, Fragrance-Free  Curel Therapeutic Moisturizing Lotion, Original Formula  Eucerin Daily Replenishing Lotion  Eucerin Dry Skin Therapy Plus Alpha Hydroxy Crme  Eucerin Dry Skin Therapy Plus Alpha Hydroxy Lotion  Eucerin Original Crme  Eucerin Original Lotion  Eucerin Plus Crme Eucerin Plus Lotion  Eucerin TriLipid Replenishing Lotion  Keri Anti-Bacterial Hand Lotion  Keri Deep Conditioning Original Lotion Dry Skin Formula Softly Scented  Keri Deep Conditioning Original Lotion, Fragrance Free Sensitive Skin Formula  Keri Lotion Fast Absorbing Fragrance Free Sensitive Skin Formula  Keri Lotion Fast Absorbing Softly Scented Dry Skin Formula  Keri Original Lotion  Keri Skin Renewal Lotion Keri Silky Smooth Lotion  Keri Silky Smooth Sensitive Skin Lotion   Nivea Body Creamy Conditioning Oil  Nivea Body Extra Enriched Teacher, adult education Moisturizing Lotion Nivea Crme  Nivea Skin Firming Lotion  NutraDerm 30 Skin Lotion  NutraDerm Skin Lotion  NutraDerm Therapeutic Skin Cream  NutraDerm Therapeutic Skin Lotion  ProShield Protective Hand Cream  Provon moisturizing lotion  WHAT IS A BLOOD TRANSFUSION? Blood Transfusion Information  A transfusion is the replacement of blood or some of its parts. Blood is made up of multiple cells which provide different functions. Red blood cells carry oxygen and are used for blood loss replacement. White blood cells fight against infection. Platelets control bleeding. Plasma helps clot blood. Other blood products are available for specialized needs, such as hemophilia or other clotting disorders. BEFORE THE TRANSFUSION  Who gives blood for transfusions?  Healthy volunteers who are fully evaluated to make sure their blood is safe. This is blood bank blood. Transfusion therapy is the safest it has ever been in the practice of medicine. Before blood is taken from a donor, a complete history is taken to make sure that person has no history of diseases nor engages in risky social behavior (examples are intravenous  drug use or sexual activity with multiple partners). The donor's travel history is screened to minimize risk of transmitting infections, such as malaria. The donated blood is tested for signs of infectious diseases, such as HIV and hepatitis. The blood is then tested to be sure it is compatible with you in order to minimize the chance of a transfusion reaction. If you or a relative donates blood, this is often done in anticipation of surgery and is not appropriate for emergency situations. It takes many days to process the donated blood. RISKS AND COMPLICATIONS Although transfusion therapy is very safe and saves many lives, the main dangers of transfusion include:  Getting  an infectious disease. Developing a transfusion reaction. This is an allergic reaction to something in the blood you were given. Every precaution is taken to prevent this. The decision to have a blood transfusion has been considered carefully by your caregiver before blood is given. Blood is not given unless the benefits outweigh the risks. AFTER THE TRANSFUSION Right after receiving a blood transfusion, you will usually feel much better and more energetic. This is especially true if your red blood cells have gotten low (anemic). The transfusion raises the level of the red blood cells which carry oxygen, and this usually causes an energy increase. The nurse administering the transfusion will monitor you carefully for complications. HOME CARE INSTRUCTIONS  No special instructions are needed after a transfusion. You may find your energy is better. Speak with your caregiver about any limitations on activity for underlying diseases you may have. SEEK MEDICAL CARE IF:  Your condition is not improving after your transfusion. You develop redness or irritation at the intravenous (IV) site. SEEK IMMEDIATE MEDICAL CARE IF:  Any of the following symptoms occur over the next 12 hours: Shaking chills. You have a temperature by mouth above 102 F (38.9 C), not controlled by medicine. Chest, back, or muscle pain. People around you feel you are not acting correctly or are confused. Shortness of breath or difficulty breathing. Dizziness and fainting. You get a rash or develop hives. You have a decrease in urine output. Your urine turns a dark color or changes to pink, red, or brown. Any of the following symptoms occur over the next 10 days: You have a temperature by mouth above 102 F (38.9 C), not controlled by medicine. Shortness of breath. Weakness after normal activity. The white part of the eye turns yellow (jaundice). You have a decrease in the amount of urine or are urinating less often. Your  urine turns a dark color or changes to pink, red, or brown. Document Released: 07/08/2000 Document Revised: 10/03/2011 Document Reviewed: 02/25/2008 ExitCare Patient Information 2014 [place], [place].  _______________________________________________________________________  Incentive Spirometer  An incentive spirometer is a tool that can help keep your lungs clear and active. This tool measures how well you are filling your lungs with each breath. Taking long deep breaths may help reverse or decrease the chance of developing breathing (pulmonary) problems (especially infection) following: A long period of time when you are unable to move or be active. BEFORE THE PROCEDURE  If the spirometer includes an indicator to show your best effort, your nurse or respiratory therapist will set it to a desired goal. If possible, sit up straight or lean slightly forward. Try not to slouch. Hold the incentive spirometer in an upright position. INSTRUCTIONS FOR USE  Sit on the edge of your bed if possible, or sit up as far as you can in bed or on  a chair. Hold the incentive spirometer in an upright position. Breathe out normally. Place the mouthpiece in your mouth and seal your lips tightly around it. Breathe in slowly and as deeply as possible, raising the piston or the ball toward the top of the column. Hold your breath for 3-5 seconds or for as long as possible. Allow the piston or ball to fall to the bottom of the column. Remove the mouthpiece from your mouth and breathe out normally. Rest for a few seconds and repeat Steps 1 through 7 at least 10 times every 1-2 hours when you are awake. Take your time and take a few normal breaths between deep breaths. The spirometer may include an indicator to show your best effort. Use the indicator as a goal to work toward during each repetition. After each set of 10 deep breaths, practice coughing to be sure your lungs are clear. If you have an incision (the cut  made at the time of surgery), support your incision when coughing by placing a pillow or rolled up towels firmly against it. Once you are able to get out of bed, walk around indoors and cough well. You may stop using the incentive spirometer when instructed by your caregiver.  RISKS AND COMPLICATIONS Take your time so you do not get dizzy or light-headed. If you are in pain, you may need to take or ask for pain medication before doing incentive spirometry. It is harder to take a deep breath if you are having pain. AFTER USE Rest and breathe slowly and easily. It can be helpful to keep track of a log of your progress. Your caregiver can provide you with a simple table to help with this. If you are using the spirometer at home, follow these instructions: SEEK MEDICAL CARE IF:  You are having difficultly using the spirometer. You have trouble using the spirometer as often as instructed. Your pain medication is not giving enough relief while using the spirometer. You develop fever of 100.5 F (38.1 C) or higher. SEEK IMMEDIATE MEDICAL CARE IF:  You cough up bloody sputum that had not been present before. You develop fever of 102 F (38.9 C) or greater. You develop worsening pain at or near the incision site. MAKE SURE YOU:  Understand these instructions. Will watch your condition. Will get help right away if you are not doing well or get worse. Document Released: 11/21/2006 Document Revised: 10/03/2011 Document Reviewed: 01/22/2007 [facility] Patient Information 2014 [place], [place].   ________________________________________________________________________

## 2023-04-07 ENCOUNTER — Other Ambulatory Visit: Payer: Self-pay

## 2023-04-07 ENCOUNTER — Encounter (HOSPITAL_COMMUNITY)
Admission: RE | Admit: 2023-04-07 | Discharge: 2023-04-07 | Disposition: A | Discharge status: Home / Self Care | Payer: Medicare Other | Source: Ambulatory Visit | Attending: Orthopedic Surgery

## 2023-04-07 ENCOUNTER — Encounter (HOSPITAL_COMMUNITY): Payer: Self-pay

## 2023-04-07 VITALS — BP 120/56 | HR 67 | Temp 98.4°F | Resp 16 | Ht 63.0 in | Wt 160.0 lb

## 2023-04-07 DIAGNOSIS — I10 Essential (primary) hypertension: Secondary | ICD-10-CM | POA: Insufficient documentation

## 2023-04-07 DIAGNOSIS — Z01818 Encounter for other preprocedural examination: Secondary | ICD-10-CM

## 2023-04-07 DIAGNOSIS — M1612 Unilateral primary osteoarthritis, left hip: Secondary | ICD-10-CM | POA: Diagnosis not present

## 2023-04-07 DIAGNOSIS — Z01812 Encounter for preprocedural laboratory examination: Secondary | ICD-10-CM | POA: Insufficient documentation

## 2023-04-07 DIAGNOSIS — Z8673 Personal history of transient ischemic attack (TIA), and cerebral infarction without residual deficits: Secondary | ICD-10-CM | POA: Insufficient documentation

## 2023-04-07 HISTORY — DX: Pneumonia, unspecified organism: J18.9

## 2023-04-07 HISTORY — DX: Cerebral infarction, unspecified: I63.9

## 2023-04-07 LAB — SURGICAL PCR SCREEN
MRSA, PCR: NEGATIVE
Staphylococcus aureus: NEGATIVE

## 2023-04-07 NOTE — H&P (Cosign Needed)
TOTAL HIP ADMISSION H&P  Patient is admitted for left total hip arthroplasty.  Subjective:  Chief Complaint: Left hip pain  HPI: Alexis Barnes, 77 y.o. female, has a history of pain and functional disability in the left hip due to arthritis and patient has failed non-surgical conservative treatments for greater than 12 weeks to include NSAID's and/or analgesics and activity modification. Onset of symptoms was gradual, starting  several  years ago with rapidlly worsening course since that time. The patient noted no past surgery on the left hip. Patient currently rates pain in the left hip at 8 out of 10 with activity. Patient has night pain, worsening of pain with activity and weight bearing, pain that interfers with activities of daily living, and pain with passive range of motion. Patient has evidence of  severe bone-on-bone arthritis in the left hip. There is evidence of erosion of the femoral head and large subchondral cysts  by imaging studies. This condition presents safety issues increasing the risk of falls. There is no current active infection.  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   Colitis 07/14/2016   Vertigo 07/14/2016   Heme positive stool 07/14/2016   Postherpetic neuralgia 07/14/2016   Family hx of colon cancer 12/11/2013   Essential hypertension, benign 12/11/2013   Fibromyalgia 12/11/2013   GERD (gastroesophageal reflux disease) 12/11/2013   Depression 12/11/2013    Past Medical History:  Diagnosis Date   Anxiety    Arthritis    Depression    Essential hypertension    Fibromyalgia    GERD (gastroesophageal reflux disease)    Hyperlipidemia    Infectious colitis    Associated with syncope and sepsis, managed at [facility] January 2015   Postherpetic neuralgia    Vertigo     Past Surgical History:  Procedure Laterality Date   COLONOSCOPY N/A 12/27/2013   Procedure: COLONOSCOPY;  Surgeon: Malissa Hippo, MD;   Location: [facility];  Service: Endoscopy;  Laterality: N/A;  955   ROBOTIC ASSISTED BILATERAL SALPINGO OOPHERECTOMY Bilateral 01/30/2013   Procedure: ROBOTIC ASSISTED BILATERAL SALPINGO OOPHORECTOMY;  Surgeon: Philip Aspen, DO;  Location: [facility] ORS;  Service: Gynecology;  Laterality: Bilateral;   Rotator cuff surgery Right 2011   TUBAL LIGATION     VAGINAL DELIVERY  [id number],9604    Prior to Admission medications   Medication Sig Start Date End Date Taking? Authorizing Provider  acetaminophen (TYLENOL) 500 MG tablet Take 500 mg by mouth every 6 (six) hours as needed for moderate pain.   Yes [provider]  aspirin EC 81 MG tablet Take 81 mg by mouth daily. Swallow whole.   Yes [provider]  Cholecalciferol (VITAMIN D) 50 MCG (2000 UT) tablet Take 2,000 Units by mouth daily.   Yes [provider]  cyanocobalamin (VITAMIN B12) 1000 MCG tablet Take 1,000 mcg by mouth daily.   Yes [provider]  ezetimibe (ZETIA) 10 MG tablet Take 1 tablet (10 mg total) by mouth daily. 07/16/16  Yes Penny Pia, MD  pravastatin (PRAVACHOL) 20 MG tablet Take 1 tablet by mouth daily. 04/02/21  Yes [provider]  SUPER B COMPLEX/C PO Take 1 tablet by mouth daily.   Yes [provider]  traMADol (ULTRAM) 50 MG tablet Take 50 mg by mouth 2 (two) times daily.   Yes [provider]    Allergies  Allergen Reactions   Statins Other (See Comments)    Muscle aches, tolerating  pravastatin     Social History   Socioeconomic History   Marital status: Married    Spouse name: Not on file   Number of children: Not on file   Years of education: Not on file   Highest education level: Not on file  Occupational History   Not on file  Tobacco Use   Smoking status: Never   Smokeless tobacco: Never  Substance and Sexual Activity   Alcohol use: No    Alcohol/week: 0.0 standard drinks of alcohol   Drug use: No   Sexual activity: Not on file  Other  Topics Concern   Not on file  Social History Narrative   Not on file   Social Determinants of Health   Financial Resource Strain: Not on file  Food Insecurity: Not on file  Transportation Needs: No Transportation Needs (10/20/2022)   Received from [facility], [facility]   PRAPARE - Transportation    Lack of Transportation (Medical): No    Lack of Transportation (Non-Medical): No  Physical Activity: Not on file  Stress: Not on file  Social Connections: Unknown (01/02/2023)   Received from [facility], [facility]   Social Network    Social Network: Not on file  Intimate Partner Violence: Unknown (01/02/2023)   Received from [facility], [facility]   HITS    Physically Hurt: Not on file    Insult or Talk Down To: Not on file    Threaten Physical Harm: Not on file    Scream or Curse: Not on file    Tobacco Use: Low Risk  (02/02/2023)   Received from [facility]   Patient History    Smoking Tobacco Use: Never    Smokeless Tobacco Use: Never    Passive Exposure: Not on file   Social History   Substance and Sexual Activity  Alcohol Use No   Alcohol/week: 0.0 standard drinks of alcohol    Family History  Problem Relation Age of Onset   Heart attack Father        Age 60   Colon cancer Sister 72    Review of Systems  Constitutional:  Negative for chills and fever.  HENT:  Negative for congestion, sore throat and tinnitus.   Eyes:  Negative for double vision, photophobia and pain.  Respiratory:  Negative for cough, shortness of breath and wheezing.   Cardiovascular:  Negative for chest pain, palpitations and orthopnea.  Gastrointestinal:  Negative for heartburn, nausea and vomiting.  Genitourinary:  Negative for dysuria, frequency and urgency.  Musculoskeletal:  Positive for joint pain.  Neurological:  Negative for dizziness, weakness and headaches.     Objective:  Physical Exam: Well nourished and well developed.  General: Alert and  oriented x3, cooperative and pleasant, no acute distress.  Head: normocephalic, atraumatic, neck supple.  Eyes: EOMI.  Musculoskeletal:  Left hip flexion to 90 with no internal rotation, about 10 degrees of external rotation, and 20 degrees of abduction.  - Pain with any attempted motion of the left hip.  - Significant antalgic gait pattern with a walker.  Calves soft and nontender. Motor function intact in LE. Strength 5/5 LE bilaterally. Neuro: Distal pulses 2+. Sensation to light touch intact in LE.  Imaging Review Plain radiographs demonstrate severe degenerative joint disease of the left hip. The bone quality appears to be adequate for age and reported activity level.  Assessment/Plan:  End stage arthritis, left hip  The patient history, physical examination, clinical judgement of  the provider and imaging studies are consistent with end stage degenerative joint disease of the left hip and total hip arthroplasty is deemed medically necessary. The treatment options including medical management, injection therapy, arthroscopy and arthroplasty were discussed at length. The risks and benefits of total hip arthroplasty were presented and reviewed. The risks due to aseptic loosening, infection, stiffness, dislocation/subluxation, thromboembolic complications and other imponderables were discussed. The patient acknowledged the explanation, agreed to proceed with the plan and consent was signed. Patient is being admitted for inpatient treatment for surgery, pain control, PT, OT, prophylactic antibiotics, VTE prophylaxis, progressive ambulation and ADLs and discharge planning.The patient is planning to be discharged  home .   Patient's anticipated LOS is less than 2 midnights, meeting these requirements: - Younger than 1 - Lives within 1 hour of care - Has a competent adult at home to recover with post-op recover - NO history of  - Chronic pain requiring opiods  - Diabetes  - Coronary Artery  Disease  - Heart failure  - Heart attack  - Stroke  - DVT/VTE  - Cardiac arrhythmia  - Respiratory Failure/COPD  - Renal failure  - Anemia  - Advanced Liver disease  Therapy Plans: HEP Disposition: Home with family Planned DVT Prophylaxis: Xarelto 10 mg QD DME Needed: None PCP: Mitzi Hansen, MD (clearance received - mod risk) Cardiologist: Dr. 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 - Follow-up visit in 2 weeks with Dr. Lequita Halt - Begin physical therapy following surgery - Pre-operative lab work as pre-surgical testing - Prescriptions will be provided in hospital at time of discharge  Arther Abbott, PA-C Orthopedic Surgery [facility] [place]

## 2023-04-10 NOTE — Progress Notes (Signed)
Anesthesia Chart Review   Case: [id number] Date/Time: 04/17/23 1225   Procedure: TOTAL HIP ARTHROPLASTY ANTERIOR APPROACH (Left: Hip)   Anesthesia type: Choice   Pre-op diagnosis: left hip osteoarthritis   Location: [facility] ROOM 10 / [facility]   Surgeons: Ollen Gross, MD       DISCUSSION:77 y.o. never smoker with h/o HTN, stroke, left hip OA scheduled for above procedure 04/17/2023 with Dr. Ollen Gross.   Pt last seen by cardiology 02/02/2023.  Stress test and echo ordered at this visit for preoperative evaluation.  Echo 03/07/2023 with LVEF 60 to 65%.  Stress test 03/07/2023 low risk study. VS: BP (!) 120/56   Pulse 67   Temp 36.9 C (Oral)   Resp 16   Ht 5\' 3"  (1.6 m)   Wt 72.6 kg   SpO2 98%   BMI 28.34 kg/m   PROVIDERS: Donetta Potts, MD is PCP   Assar, Rayetta Pigg, DO is Cardiologist  LABS:  labs on chart (all labs ordered are listed, but only abnormal results are displayed)  Labs Reviewed  SURGICAL PCR SCREEN  TYPE AND SCREEN     IMAGES:   EKG:   CV: Echo 03/07/2023 Summary   1. The left ventricle is normal in size with normal wall thickness.    2. The left ventricular systolic function is normal, LVEF is visually  estimated at 60-65%.    3. There is grade I diastolic dysfunction (impaired relaxation).    4. The left atrium is mildly dilated in size.    5. The right ventricle is upper normal in size, with normal systolic  function.   Myocardial Perfusion 03/07/2023 1. No reversible ischemia or infarction.   2. Normal left ventricular wall motion.   3. Left ventricular ejection fraction 88%   4. Non invasive risk stratification*: Low  Past Medical History:  Diagnosis Date   Anxiety    Arthritis    Depression    Essential hypertension    Fibromyalgia    GERD (gastroesophageal reflux disease)    Hyperlipidemia    Infectious colitis    Associated with syncope and sepsis, managed at [facility] January 2015   Pneumonia    Postherpetic neuralgia    Stroke  [facility])    Vertigo     Past Surgical History:  Procedure Laterality Date   COLONOSCOPY N/A 12/27/2013   Procedure: COLONOSCOPY;  Surgeon: Malissa Hippo, MD;  Location: [facility];  Service: Endoscopy;  Laterality: N/A;  955   ROBOTIC ASSISTED BILATERAL SALPINGO OOPHERECTOMY Bilateral 01/30/2013   Procedure: ROBOTIC ASSISTED BILATERAL SALPINGO OOPHORECTOMY;  Surgeon: Philip Aspen, DO;  Location: [facility];  Service: Gynecology;  Laterality: Bilateral;   Rotator cuff surgery Right 2011   TUBAL LIGATION     VAGINAL DELIVERY  4540,9811    MEDICATIONS:  amoxicillin-clavulanate (AUGMENTIN) 500-125 MG tablet   acetaminophen (TYLENOL) 500 MG tablet   aspirin EC 81 MG tablet   Cholecalciferol (VITAMIN D) 50 MCG (2000 UT) tablet   cyanocobalamin (VITAMIN B12) 1000 MCG tablet   ezetimibe (ZETIA) 10 MG tablet   pravastatin (PRAVACHOL) 20 MG tablet   SUPER B COMPLEX/C PO   traMADol (ULTRAM) 50 MG tablet   No current facility-administered medications for this encounter.     Jodell Cipro Ward, PA-C [facility] [phone]

## 2023-04-10 NOTE — Anesthesia Preprocedure Evaluation (Addendum)
Anesthesia Evaluation  Patient identified by MRN, date of birth, ID band Patient awake    Reviewed: Allergy & Precautions, H&P , NPO status , Patient's Chart, lab work & pertinent test results  Airway Mallampati: III  TM Distance: >3 FB Neck ROM: Full    Dental  (+) Teeth Intact, Dental Advisory Given   Pulmonary neg pulmonary ROS   Pulmonary exam normal breath sounds clear to auscultation       Cardiovascular Hypertension: no home meds. negative cardio ROS Normal cardiovascular exam Rhythm:Regular Rate:Normal  Echo 03/07/2023 with LVEF 60 to 65%.   Stress test 03/07/2023 low risk study.    Neuro/Psych  PSYCHIATRIC DISORDERS Anxiety Depression    CVA, No Residual Symptoms  negative psych ROS   GI/Hepatic Neg liver ROS,GERD  Controlled,,  Endo/Other  negative endocrine ROS    Renal/GU negative Renal ROS  negative genitourinary   Musculoskeletal  (+) Arthritis , Osteoarthritis,  Fibromyalgia -  Abdominal   Peds negative pediatric ROS (+)  Hematology negative hematology ROS (+)   Anesthesia Other Findings   Reproductive/Obstetrics negative OB ROS                             Anesthesia Physical Anesthesia Plan  ASA: 2  Anesthesia Plan: Spinal, Regional and MAC   Post-op Pain Management: Regional block* and Tylenol PO (pre-op)*   Induction:   PONV Risk Score and Plan: 2 and Propofol infusion and TIVA  Airway Management Planned: Natural Airway and Nasal Cannula  Additional Equipment: None  Intra-op Plan:   Post-operative Plan:   Informed Consent: I have reviewed the patients History and Physical, chart, labs and discussed the procedure including the risks, benefits and alternatives for the proposed anesthesia with the patient or authorized representative who has indicated his/her understanding and acceptance.     Dental advisory given  Plan Discussed with: CRNA  Anesthesia  Plan Comments:        Anesthesia Quick Evaluation

## 2023-04-17 ENCOUNTER — Other Ambulatory Visit: Payer: Self-pay

## 2023-04-17 ENCOUNTER — Encounter (HOSPITAL_COMMUNITY): Payer: Self-pay | Admitting: Orthopedic Surgery

## 2023-04-17 ENCOUNTER — Ambulatory Visit (HOSPITAL_COMMUNITY): Payer: Medicare Other

## 2023-04-17 ENCOUNTER — Ambulatory Visit (HOSPITAL_COMMUNITY): Payer: Medicare Other | Admitting: Physician Assistant

## 2023-04-17 ENCOUNTER — Inpatient Hospital Stay (HOSPITAL_COMMUNITY)
Admission: RE | Admit: 2023-04-17 | Discharge: 2023-04-19 | DRG: 470 | Disposition: A | Discharge status: Home / Self Care | Payer: Medicare Other | Attending: Orthopedic Surgery | Admitting: Orthopedic Surgery

## 2023-04-17 ENCOUNTER — Encounter (HOSPITAL_COMMUNITY)
Admission: RE | Disposition: A | Discharge status: Home / Self Care | Payer: Self-pay | Source: Home / Self Care | Attending: Orthopedic Surgery

## 2023-04-17 ENCOUNTER — Ambulatory Visit (HOSPITAL_COMMUNITY): Payer: Medicare Other | Admitting: Anesthesiology

## 2023-04-17 DIAGNOSIS — M1612 Unilateral primary osteoarthritis, left hip: Principal | ICD-10-CM | POA: Diagnosis present

## 2023-04-17 DIAGNOSIS — K219 Gastro-esophageal reflux disease without esophagitis: Secondary | ICD-10-CM | POA: Diagnosis present

## 2023-04-17 DIAGNOSIS — Z8673 Personal history of transient ischemic attack (TIA), and cerebral infarction without residual deficits: Secondary | ICD-10-CM

## 2023-04-17 DIAGNOSIS — Z800 Family history of malignant neoplasm of digestive organs: Secondary | ICD-10-CM

## 2023-04-17 DIAGNOSIS — I10 Essential (primary) hypertension: Secondary | ICD-10-CM | POA: Diagnosis present

## 2023-04-17 DIAGNOSIS — Z8249 Family history of ischemic heart disease and other diseases of the circulatory system: Secondary | ICD-10-CM

## 2023-04-17 DIAGNOSIS — F32A Depression, unspecified: Secondary | ICD-10-CM | POA: Diagnosis present

## 2023-04-17 DIAGNOSIS — Z888 Allergy status to other drugs, medicaments and biological substances status: Secondary | ICD-10-CM

## 2023-04-17 DIAGNOSIS — Z79899 Other long term (current) drug therapy: Secondary | ICD-10-CM

## 2023-04-17 DIAGNOSIS — E785 Hyperlipidemia, unspecified: Secondary | ICD-10-CM | POA: Diagnosis present

## 2023-04-17 DIAGNOSIS — M169 Osteoarthritis of hip, unspecified: Principal | ICD-10-CM | POA: Diagnosis present

## 2023-04-17 DIAGNOSIS — M797 Fibromyalgia: Secondary | ICD-10-CM | POA: Diagnosis present

## 2023-04-17 DIAGNOSIS — F419 Anxiety disorder, unspecified: Secondary | ICD-10-CM | POA: Diagnosis present

## 2023-04-17 DIAGNOSIS — Z7982 Long term (current) use of aspirin: Secondary | ICD-10-CM

## 2023-04-17 HISTORY — PX: TOTAL HIP ARTHROPLASTY: SHX124

## 2023-04-17 LAB — TYPE AND SCREEN
ABO/RH(D): A NEG
Antibody Screen: NEGATIVE

## 2023-04-17 LAB — ABO/RH: ABO/RH(D): A NEG

## 2023-04-17 SURGERY — ARTHROPLASTY, HIP, TOTAL, ANTERIOR APPROACH
Anesthesia: Monitor Anesthesia Care | Site: Hip | Laterality: Left

## 2023-04-17 MED ORDER — ACETAMINOPHEN 325 MG PO TABS: 325.0000 mg | ORAL_TABLET | Freq: Four times a day (QID) | ORAL | Status: DC | PRN

## 2023-04-17 MED ORDER — DOCUSATE SODIUM 100 MG PO CAPS
100.0000 mg | ORAL_CAPSULE | Freq: Two times a day (BID) | ORAL | Status: DC
  Administered 2023-04-17 – 2023-04-19 (×4): 100 mg via ORAL
  Filled 2023-04-17 (×4): qty 1

## 2023-04-17 MED ORDER — BUPIVACAINE IN DEXTROSE 0.75-8.25 % IT SOLN
INTRATHECAL | Status: DC | PRN
  Administered 2023-04-17: 1.6 mL via INTRATHECAL

## 2023-04-17 MED ORDER — OXYCODONE HCL 5 MG PO TABS: 5.0000 mg | ORAL_TABLET | Freq: Once | ORAL | Status: DC | PRN

## 2023-04-17 MED ORDER — ONDANSETRON HCL 4 MG/2ML IJ SOLN
INTRAMUSCULAR | Status: AC
  Filled 2023-04-17: qty 2

## 2023-04-17 MED ORDER — MENTHOL 3 MG MT LOZG: 1.0000 | LOZENGE | OROMUCOSAL | Status: DC | PRN

## 2023-04-17 MED ORDER — HYDROMORPHONE HCL 1 MG/ML IJ SOLN: 0.2500 mg | INTRAMUSCULAR | Status: DC | PRN

## 2023-04-17 MED ORDER — POLYETHYLENE GLYCOL 3350 17 G PO PACK: 17.0000 g | PACK | Freq: Every day | ORAL | Status: DC | PRN

## 2023-04-17 MED ORDER — CEFAZOLIN SODIUM-DEXTROSE 2-4 GM/100ML-% IV SOLN
2.0000 g | INTRAVENOUS | Status: AC
  Administered 2023-04-17: 2 g via INTRAVENOUS
  Filled 2023-04-17: qty 100

## 2023-04-17 MED ORDER — PHENYLEPHRINE HCL-NACL 20-0.9 MG/250ML-% IV SOLN
INTRAVENOUS | Status: DC | PRN
Start: 2023-04-17 — End: 2023-04-17
  Administered 2023-04-17: 20 ug/min via INTRAVENOUS

## 2023-04-17 MED ORDER — METHOCARBAMOL 500 MG PO TABS
500.0000 mg | ORAL_TABLET | Freq: Four times a day (QID) | ORAL | Status: DC | PRN
  Administered 2023-04-17: 500 mg via ORAL
  Filled 2023-04-17: qty 1

## 2023-04-17 MED ORDER — TRAMADOL HCL 50 MG PO TABS
50.0000 mg | ORAL_TABLET | Freq: Four times a day (QID) | ORAL | Status: DC | PRN
  Administered 2023-04-19: 100 mg via ORAL
  Filled 2023-04-17: qty 1
  Filled 2023-04-17: qty 2

## 2023-04-17 MED ORDER — EPHEDRINE 5 MG/ML INJ
INTRAVENOUS | Status: AC
  Filled 2023-04-17: qty 5

## 2023-04-17 MED ORDER — PROPOFOL 1000 MG/100ML IV EMUL
INTRAVENOUS | Status: AC
  Filled 2023-04-17: qty 100

## 2023-04-17 MED ORDER — DIPHENHYDRAMINE HCL 12.5 MG/5ML PO ELIX
12.5000 mg | ORAL_SOLUTION | ORAL | Status: DC | PRN
  Administered 2023-04-18: 25 mg via ORAL
  Filled 2023-04-17: qty 10

## 2023-04-17 MED ORDER — ONDANSETRON HCL 4 MG/2ML IJ SOLN
INTRAMUSCULAR | Status: DC | PRN
  Administered 2023-04-17: 4 mg via INTRAVENOUS

## 2023-04-17 MED ORDER — METOCLOPRAMIDE HCL 5 MG/ML IJ SOLN: 5.0000 mg | Freq: Three times a day (TID) | INTRAMUSCULAR | Status: DC | PRN

## 2023-04-17 MED ORDER — BUPIVACAINE-EPINEPHRINE (PF) 0.25% -1:200000 IJ SOLN
INTRAMUSCULAR | Status: DC | PRN
  Administered 2023-04-17: 30 mL via PERINEURAL

## 2023-04-17 MED ORDER — DEXAMETHASONE SODIUM PHOSPHATE 10 MG/ML IJ SOLN
8.0000 mg | Freq: Once | INTRAMUSCULAR | Status: AC
  Administered 2023-04-17: 8 mg via INTRAVENOUS

## 2023-04-17 MED ORDER — TRANEXAMIC ACID 1000 MG/10ML IV SOLN
INTRAVENOUS | Status: DC | PRN
  Administered 2023-04-17: 2000 mg via TOPICAL

## 2023-04-17 MED ORDER — 0.9 % SODIUM CHLORIDE (POUR BTL) OPTIME
TOPICAL | Status: DC | PRN
  Administered 2023-04-17: 1000 mL

## 2023-04-17 MED ORDER — ONDANSETRON HCL 4 MG PO TABS: 4.0000 mg | ORAL_TABLET | Freq: Four times a day (QID) | ORAL | Status: DC | PRN

## 2023-04-17 MED ORDER — RIVAROXABAN 10 MG PO TABS
10.0000 mg | ORAL_TABLET | Freq: Every day | ORAL | Status: DC
  Administered 2023-04-18 – 2023-04-19 (×2): 10 mg via ORAL
  Filled 2023-04-17 (×2): qty 1

## 2023-04-17 MED ORDER — ACETAMINOPHEN 500 MG PO TABS: 1000.0000 mg | ORAL_TABLET | Freq: Once | ORAL | Status: DC

## 2023-04-17 MED ORDER — SODIUM CHLORIDE 0.9 % IV SOLN: INTRAVENOUS | Status: DC

## 2023-04-17 MED ORDER — CEFAZOLIN SODIUM-DEXTROSE 2-4 GM/100ML-% IV SOLN
2.0000 g | Freq: Four times a day (QID) | INTRAVENOUS | Status: AC
  Administered 2023-04-17 – 2023-04-18 (×2): 2 g via INTRAVENOUS
  Filled 2023-04-17 (×2): qty 100

## 2023-04-17 MED ORDER — PROPOFOL 500 MG/50ML IV EMUL
INTRAVENOUS | Status: DC | PRN
  Administered 2023-04-17: 40 ug/kg/min via INTRAVENOUS

## 2023-04-17 MED ORDER — ONDANSETRON HCL 4 MG/2ML IJ SOLN: 4.0000 mg | Freq: Four times a day (QID) | INTRAMUSCULAR | Status: DC | PRN

## 2023-04-17 MED ORDER — HYDROCODONE-ACETAMINOPHEN 5-325 MG PO TABS
1.0000 | ORAL_TABLET | ORAL | Status: DC | PRN
  Administered 2023-04-17 (×2): 1 via ORAL
  Administered 2023-04-18 – 2023-04-19 (×5): 2 via ORAL
  Filled 2023-04-17 (×2): qty 2
  Filled 2023-04-17: qty 1
  Filled 2023-04-17 (×3): qty 2
  Filled 2023-04-17: qty 1

## 2023-04-17 MED ORDER — ONDANSETRON HCL 4 MG/2ML IJ SOLN: 4.0000 mg | Freq: Once | INTRAMUSCULAR | Status: DC | PRN

## 2023-04-17 MED ORDER — LACTATED RINGERS IV SOLN: INTRAVENOUS | Status: DC

## 2023-04-17 MED ORDER — WATER FOR IRRIGATION, STERILE IR SOLN
Status: DC | PRN
  Administered 2023-04-17: 2000 mL

## 2023-04-17 MED ORDER — POVIDONE-IODINE 10 % EX SWAB
2.0000 | Freq: Once | CUTANEOUS | Status: AC
  Administered 2023-04-17: 2 via TOPICAL

## 2023-04-17 MED ORDER — CHLORHEXIDINE GLUCONATE 0.12 % MT SOLN
15.0000 mL | Freq: Once | OROMUCOSAL | Status: AC
  Administered 2023-04-17: 15 mL via OROMUCOSAL

## 2023-04-17 MED ORDER — FLEET ENEMA RE ENEM: 1.0000 | ENEMA | Freq: Once | RECTAL | Status: DC | PRN

## 2023-04-17 MED ORDER — FENTANYL CITRATE (PF) 100 MCG/2ML IJ SOLN
INTRAMUSCULAR | Status: AC
  Filled 2023-04-17: qty 2

## 2023-04-17 MED ORDER — OXYCODONE HCL 5 MG/5ML PO SOLN: 5.0000 mg | Freq: Once | ORAL | Status: DC | PRN

## 2023-04-17 MED ORDER — DEXAMETHASONE SODIUM PHOSPHATE 10 MG/ML IJ SOLN
INTRAMUSCULAR | Status: AC
  Filled 2023-04-17: qty 1

## 2023-04-17 MED ORDER — BISACODYL 10 MG RE SUPP: 10.0000 mg | Freq: Every day | RECTAL | Status: DC | PRN

## 2023-04-17 MED ORDER — TRANEXAMIC ACID 1000 MG/10ML IV SOLN
2000.0000 mg | Freq: Once | INTRAVENOUS | Status: DC
  Filled 2023-04-17: qty 20

## 2023-04-17 MED ORDER — MORPHINE SULFATE (PF) 2 MG/ML IV SOLN: 0.5000 mg | INTRAVENOUS | Status: DC | PRN

## 2023-04-17 MED ORDER — EPHEDRINE SULFATE-NACL 50-0.9 MG/10ML-% IV SOSY
PREFILLED_SYRINGE | INTRAVENOUS | Status: DC | PRN
  Administered 2023-04-17: 5 mg via INTRAVENOUS

## 2023-04-17 MED ORDER — BUPIVACAINE-EPINEPHRINE 0.25% -1:200000 IJ SOLN
INTRAMUSCULAR | Status: AC
  Filled 2023-04-17: qty 1

## 2023-04-17 MED ORDER — EZETIMIBE 10 MG PO TABS
10.0000 mg | ORAL_TABLET | Freq: Every day | ORAL | Status: DC
  Administered 2023-04-18 – 2023-04-19 (×2): 10 mg via ORAL
  Filled 2023-04-17 (×2): qty 1

## 2023-04-17 MED ORDER — AMISULPRIDE (ANTIEMETIC) 5 MG/2ML IV SOLN: 10.0000 mg | Freq: Once | INTRAVENOUS | Status: DC | PRN

## 2023-04-17 MED ORDER — DEXAMETHASONE SODIUM PHOSPHATE 10 MG/ML IJ SOLN
10.0000 mg | Freq: Once | INTRAMUSCULAR | Status: AC
  Administered 2023-04-18: 10 mg via INTRAVENOUS
  Filled 2023-04-17: qty 1

## 2023-04-17 MED ORDER — ORAL CARE MOUTH RINSE: 15.0000 mL | Freq: Once | OROMUCOSAL | Status: AC

## 2023-04-17 MED ORDER — METHOCARBAMOL 1000 MG/10ML IJ SOLN: 500.0000 mg | Freq: Four times a day (QID) | INTRAVENOUS | Status: DC | PRN

## 2023-04-17 MED ORDER — PRAVASTATIN SODIUM 20 MG PO TABS
20.0000 mg | ORAL_TABLET | Freq: Every day | ORAL | Status: DC
  Administered 2023-04-18 – 2023-04-19 (×2): 20 mg via ORAL
  Filled 2023-04-17 (×2): qty 1

## 2023-04-17 MED ORDER — FENTANYL CITRATE (PF) 100 MCG/2ML IJ SOLN
INTRAMUSCULAR | Status: DC | PRN
  Administered 2023-04-17: 50 ug via INTRAVENOUS

## 2023-04-17 MED ORDER — PHENOL 1.4 % MT LIQD: 1.0000 | OROMUCOSAL | Status: DC | PRN

## 2023-04-17 MED ORDER — ACETAMINOPHEN 10 MG/ML IV SOLN
1000.0000 mg | Freq: Four times a day (QID) | INTRAVENOUS | Status: DC
  Administered 2023-04-17: 1000 mg via INTRAVENOUS
  Filled 2023-04-17: qty 100

## 2023-04-17 MED ORDER — METOCLOPRAMIDE HCL 5 MG PO TABS: 5.0000 mg | ORAL_TABLET | Freq: Three times a day (TID) | ORAL | Status: DC | PRN

## 2023-04-17 SURGICAL SUPPLY — 41 items
ADH SKN CLS APL DERMABOND .7 (GAUZE/BANDAGES/DRESSINGS) ×1
BAG COUNTER SPONGE SURGICOUNT (BAG) IMPLANT
BAG SPEC THK2 15X12 ZIP CLS (MISCELLANEOUS)
BAG SPNG CNTER NS LX DISP (BAG)
BAG ZIPLOCK 12X15 (MISCELLANEOUS) IMPLANT
BLADE SAG 18X100X1.27 (BLADE) ×1 IMPLANT
COVER PERINEAL POST (MISCELLANEOUS) ×1 IMPLANT
COVER SURGICAL LIGHT HANDLE (MISCELLANEOUS) ×1 IMPLANT
CUP ACET PINNACLE SECTR 50MM (Hips) IMPLANT
DERMABOND ADVANCED .7 DNX12 (GAUZE/BANDAGES/DRESSINGS) ×1 IMPLANT
DRAPE FOOT SWITCH (DRAPES) ×1 IMPLANT
DRAPE STERI IOBAN 125X83 (DRAPES) ×1 IMPLANT
DRAPE U-SHAPE 47X51 STRL (DRAPES) ×2 IMPLANT
DRSG AQUACEL AG ADV 3.5X10 (GAUZE/BANDAGES/DRESSINGS) ×1 IMPLANT
DURAPREP 26ML APPLICATOR (WOUND CARE) ×1 IMPLANT
ELECT REM PT RETURN 15FT ADLT (MISCELLANEOUS) ×1 IMPLANT
GLOVE BIO SURGEON STRL SZ 6.5 (GLOVE) IMPLANT
GLOVE BIO SURGEON STRL SZ8 (GLOVE) ×1 IMPLANT
GLOVE BIOGEL PI IND STRL 6.5 (GLOVE) IMPLANT
GLOVE BIOGEL PI IND STRL 7.0 (GLOVE) IMPLANT
GLOVE BIOGEL PI IND STRL 8 (GLOVE) ×1 IMPLANT
GOWN STRL REUS W/ TWL LRG LVL3 (GOWN DISPOSABLE) ×1 IMPLANT
GOWN STRL REUS W/TWL LRG LVL3 (GOWN DISPOSABLE) ×1
HEAD FEM STD 32X+13 STRL (Hips) IMPLANT
HOLDER FOLEY CATH W/STRAP (MISCELLANEOUS) ×1 IMPLANT
KIT TURNOVER KIT A (KITS) IMPLANT
LINER MARATHON 32 50 (Hips) IMPLANT
MANIFOLD NEPTUNE II (INSTRUMENTS) ×1 IMPLANT
PACK ANTERIOR HIP CUSTOM (KITS) ×1 IMPLANT
PENCIL SMOKE EVACUATOR COATED (MISCELLANEOUS) ×1 IMPLANT
PINNACLE SECTOR CUP 50MM (Hips) ×1 IMPLANT
SPIKE FLUID TRANSFER (MISCELLANEOUS) ×1 IMPLANT
STEM FEM ACTIS HIGH SZ3 (Stem) IMPLANT
SUT ETHIBOND NAB CT1 #1 30IN (SUTURE) ×1 IMPLANT
SUT MNCRL AB 4-0 PS2 18 (SUTURE) ×1 IMPLANT
SUT STRATAFIX 0 PDS 27 VIOLET (SUTURE) ×1
SUT VIC AB 2-0 CT1 27 (SUTURE) ×2
SUT VIC AB 2-0 CT1 TAPERPNT 27 (SUTURE) ×2 IMPLANT
SUTURE STRATFX 0 PDS 27 VIOLET (SUTURE) ×1 IMPLANT
TRAY FOLEY MTR SLVR 14FR STAT (SET/KITS/TRAYS/PACK) IMPLANT
TUBE SUCTION HIGH CAP CLEAR NV (SUCTIONS) ×1 IMPLANT

## 2023-04-17 NOTE — Op Note (Signed)
OPERATIVE REPORT- TOTAL HIP ARTHROPLASTY   PREOPERATIVE DIAGNOSIS: Osteoarthritis of the Left hip.   POSTOPERATIVE DIAGNOSIS: Osteoarthritis of the Left  hip.   PROCEDURE: Left total hip arthroplasty, anterior approach.   SURGEON: Ollen Gross, MD   ASSISTANT: Arcola Jansky, PA-C  ANESTHESIA:  Spinal  ESTIMATED BLOOD LOSS:-200 mL    DRAINS: None  COMPLICATIONS: None   CONDITION: PACU - hemodynamically stable.   BRIEF CLINICAL NOTE: Alexis Barnes is a 77 y.o. female who has advanced end-  stage arthritis of their Left  hip with progressively worsening pain and  dysfunction.The patient has failed nonoperative management and presents for  total hip arthroplasty.   PROCEDURE IN DETAIL: After successful administration of spinal  anesthetic, the traction boots for the [facility] bed were placed on both  feet and the patient was placed onto the [facility] bed, boots placed into the leg  holders. The Left hip was then isolated from the perineum with plastic  drapes and prepped and draped in the usual sterile fashion. ASIS and  greater trochanter were marked and a oblique incision was made, starting  at about 1 cm lateral and 2 cm distal to the ASIS and coursing towards  the anterior cortex of the femur. The skin was cut with a 10 blade  through subcutaneous tissue to the level of the fascia overlying the  tensor fascia lata muscle. The fascia was then incised in line with the  incision at the junction of the anterior third and posterior 2/3rd. The  muscle was teased off the fascia and then the interval between the TFL  and the rectus was developed. The Hohmann retractor was then placed at  the top of the femoral neck over the capsule. The vessels overlying the  capsule were cauterized and the fat on top of the capsule was removed.  A Hohmann retractor was then placed anterior underneath the rectus  femoris to give exposure to the entire anterior capsule. A T-shaped   capsulotomy was performed. The edges were tagged and the femoral head  was identified.       Osteophytes are removed off the superior acetabulum.  The femoral neck was then cut in situ with an oscillating saw. Traction  was then applied to the left lower extremity utilizing the [facility]  traction. The femoral head was then removed. Retractors were placed  around the acetabulum and then circumferential removal of the labrum was  performed. Osteophytes were also removed. Reaming starts at 47 mm to  medialize and  Increased in 2 mm increments to 49 mm. We reamed in  approximately 40 degrees of abduction, 20 degrees anteversion. A 50 mm  pinnacle acetabular shell was then impacted in anatomic position under  fluoroscopic guidance with excellent purchase. We did not need to place  any additional dome screws. A 32 mm neutral + 4 marathon liner was then  placed into the acetabular shell.       The femoral lift was then placed along the lateral aspect of the femur  just distal to the vastus ridge. The leg was  externally rotated and capsule  was stripped off the inferior aspect of the femoral neck down to the  level of the lesser trochanter, this was done with electrocautery. The femur was lifted after this was performed. The  leg was then placed in an extended and adducted position essentially delivering the femur. We also removed the capsule superiorly and the piriformis from the piriformis fossa to  gain excellent exposure of the  proximal femur. Rongeur was used to remove some cancellous bone to get  into the lateral portion of the proximal femur for placement of the  initial starter reamer. The starter broaches was placed  the starter broach  and was shown to go down the center of the canal. Broaching  with the Actis system was then performed starting at size 0  coursing  Up to size 3. A size 3 had excellent torsional and rotational  and axial stability. The trial high offset neck was then placed   with a 32 + 13.5 trial head. The hip was then reduced. We confirmed that  the stem was in the canal both on AP and lateral x-rays. It also has excellent sizing. The hip was reduced with outstanding stability through full extension and full external rotation.. AP pelvis was taken and the leg lengths were measured and found to be equal. Hip was then dislocated again and the femoral head and neck removed. The  femoral broach was removed. Size 3 Actis stem with a high offset  neck was then impacted into the femur following native anteversion. Has  excellent purchase in the canal. Excellent torsional and rotational and  axial stability. It is confirmed to be in the canal on AP and lateral  fluoroscopic views. The 32 + 13.5 metal head was placed and the hip  reduced with outstanding stability. Again AP pelvis was taken and it  confirmed that the leg lengths were equal. The wound was then copiously  irrigated with saline solution and the capsule reattached and repaired  with Ethibond suture. 30 ml of .25% Bupivicaine was  injected into the capsule and into the edge of the tensor fascia lata as well as subcutaneous tissue. The fascia overlying the tensor fascia lata was then closed with a running #1 V-Loc. Subcu was closed with interrupted 2-0 Vicryl and subcuticular running 4-0 Monocryl. Incision was cleaned  and dried. Steri-Strips and a bulky sterile dressing applied. The patient was awakened and transported to  recovery in stable condition.        Please note that a surgical assistant was a medical necessity for this procedure to perform it in a safe and expeditious manner. Assistant was necessary to provide appropriate retraction of vital neurovascular structures and to prevent femoral fracture and allow for anatomic placement of the prosthesis.  Ollen Gross, M.D.

## 2023-04-17 NOTE — Transfer of Care (Signed)
Immediate Anesthesia Transfer of Care Note  Patient: Alexis Barnes  Procedure(s) Performed: TOTAL HIP ARTHROPLASTY ANTERIOR APPROACH (Left: Hip)  Patient Location: PACU  Anesthesia Type:Spinal  Level of Consciousness: sedated  Airway & Oxygen Therapy: Patient Spontanous Breathing and Patient connected to face mask oxygen  Post-op Assessment: Report given to RN and Post -op Vital signs reviewed and stable  Post vital signs: Reviewed and stable  Last Vitals:  Vitals Value Taken Time  BP    Temp    Pulse 78 04/17/23 1435  Resp 19 04/17/23 1435  SpO2 99 % 04/17/23 1435  Vitals shown include unfiled device data.  Last Pain:  Vitals:   04/17/23 1127  TempSrc: Oral  PainSc:          Complications: No notable events documented.

## 2023-04-17 NOTE — Anesthesia Postprocedure Evaluation (Signed)
Anesthesia Post Note  Patient: Alexis Barnes  Procedure(s) Performed: TOTAL HIP ARTHROPLASTY ANTERIOR APPROACH (Left: Hip)     Patient location during evaluation: PACU Anesthesia Type: Regional, MAC and Spinal Level of consciousness: awake and alert and oriented Pain management: pain level controlled Vital Signs Assessment: post-procedure vital signs reviewed and stable Respiratory status: spontaneous breathing, nonlabored ventilation and respiratory function stable Cardiovascular status: blood pressure returned to baseline and stable Postop Assessment: no headache, no backache, spinal receding and patient able to bend at knees Anesthetic complications: no   No notable events documented.  Last Vitals:  Vitals:   04/17/23 1500 04/17/23 1515  BP: 120/60 114/71  Pulse: 77 80  Resp: 18 17  Temp:    SpO2: 95% 98%    Last Pain:  Vitals:   04/17/23 1515  TempSrc:   PainSc: 0-No pain                 Lannie Fields

## 2023-04-17 NOTE — Evaluation (Signed)
Physical Therapy Evaluation Patient Details Name: Alexis Barnes MRN: [id number] DOB: 07-11-1946 Today's Date: 04/17/2023  History of Present Illness  77 yo female presents to therapy s/p L THA, anterior approach on 04/17/2023 due to failure of conservative measures. Pt PMH includes but is not limited to: CVA (2021 and 2023), colitis, vertigo, HTN, fibromyalgia, GERD, and R RTC surgery.  Clinical Impression     Alexis Barnes is a 77 y.o. female POD 0 s/p L THA. Patient reports mod I for gait with rollator and required assist for ADLs and IADLs  with mobility at baseline. Patient is now limited by functional impairments (see PT problem list below) and requires min A  for bed mobility and min A for transfers. Patient was unable to safely ambulate at time of eval due to reports of B LE numbness and tingling indicative of slow regression of anesthesia.  Patient will benefit from continued skilled PT interventions to address impairments and progress towards PLOF. Acute PT will follow to progress mobility and stair training in preparation for safe discharge to daughter's home with HEP.       If plan is discharge home, recommend the following: A little help with walking and/or transfers;A little help with bathing/dressing/bathroom;Assistance with cooking/housework;Assist for transportation;Help with stairs or ramp for entrance   Can travel by private vehicle        Equipment Recommendations Rolling walker (2 wheels)  Recommendations for Other Services       Functional Status Assessment Patient has had a recent decline in their functional status and demonstrates the ability to make significant improvements in function in a reasonable and predictable amount of time.     Precautions / Restrictions Precautions Precautions: Fall Restrictions Weight Bearing Restrictions: No      Mobility  Bed Mobility Overal bed mobility: Needs Assistance Bed Mobility: Supine to Sit     Supine to  sit: Min assist, Used rails, HOB elevated     General bed mobility comments: min cues and increased time    Transfers Overall transfer level: Needs assistance Equipment used: Rolling walker (2 wheels) Transfers: Sit to/from Stand, Bed to chair/wheelchair/BSC Sit to Stand: Min assist, From elevated surface Stand pivot transfers: Min assist, From elevated surface         General transfer comment: min cues with pt indicating B LE numbness    Ambulation/Gait               General Gait Details: NT pt indicated slow regresison of anesthesia and difficulty feeling feet and advancing with SPT at RW level  Stairs            Wheelchair Mobility     Tilt Bed    Modified Rankin (Stroke Patients Only)       Balance Overall balance assessment: Needs assistance Sitting-balance support: Feet supported Sitting balance-Leahy Scale: Good     Standing balance support: Bilateral upper extremity supported, During functional activity, Reliant on assistive device for balance Standing balance-Leahy Scale: Poor                               Pertinent Vitals/Pain Pain Assessment Pain Assessment: Faces Faces Pain Scale: Hurts little more Pain Location: L hip and leg, LBP Pain Descriptors / Indicators: Constant, Aching, Discomfort, Dull, Operative site guarding Pain Intervention(s): Limited activity within patient's tolerance, Monitored during session, Premedicated before session, Repositioned, Ice applied    Home Living Family/patient expects to  be discharged to:: Private residence Living Arrangements: Children Available Help at Discharge: Family Type of Home: House (townhome) Home Access: Stairs to enter Entrance Stairs-Rails: None Entrance Stairs-Number of Steps: 1   Home Layout: Able to live on main level with bedroom/bathroom;Two level Home Equipment: Rollator (4 wheels) Additional Comments: pt reports transitioning to eldest daughter's home about one  month ago due to incresaed assist for ADL and IADLs. pt reports daughter works and she will be at home alone during the day    Prior Function Prior Level of Function : Needs assist       Physical Assist : ADLs (physical)   ADLs (physical): IADLs;Dressing;Bathing Mobility Comments: mod I for short distances bed to couch and couch to bathroom at rollator level. ADLs Comments: pt reports need for assist with lower body dressing, shower transfers and showering     Extremity/Trunk Assessment        Lower Extremity Assessment Lower Extremity Assessment: LLE deficits/detail LLE Deficits / Details: ankle DF/PF 5/5 LLE Sensation: decreased light touch;history of peripheral neuropathy    Cervical / Trunk Assessment Cervical / Trunk Assessment:  (slight head forward)  Communication   Communication Communication: No apparent difficulties  Cognition Arousal: Alert Behavior During Therapy: WFL for tasks assessed/performed Overall Cognitive Status: Within Functional Limits for tasks assessed                                          General Comments      Exercises     Assessment/Plan    PT Assessment Patient needs continued PT services  PT Problem List Decreased strength;Decreased range of motion;Decreased activity tolerance;Decreased balance;Decreased mobility;Decreased coordination;Pain       PT Treatment Interventions DME instruction;Gait training;Stair training;Functional mobility training;Therapeutic activities;Balance training;Therapeutic exercise;Neuromuscular re-education;Patient/family education;Modalities    PT Goals (Current goals can be found in the Care Plan section)  Acute Rehab PT Goals Patient Stated Goal: to be able to manage pain to sleep through the night and to be able to increase IND to return safely to personal home PT Goal Formulation: With patient Time For Goal Achievement: 05/01/23 Potential to Achieve Goals: Good    Frequency  7X/week     Co-evaluation               AM-PAC PT "6 Clicks" Mobility  Outcome Measure Help needed turning from your back to your side while in a flat bed without using bedrails?: A Little Help needed moving from lying on your back to sitting on the side of a flat bed without using bedrails?: A Little Help needed moving to and from a bed to a chair (including a wheelchair)?: A Little Help needed standing up from a chair using your arms (e.g., wheelchair or bedside chair)?: A Little Help needed to walk in hospital room?: Total Help needed climbing 3-5 steps with a railing? : Total 6 Click Score: 14    End of Session Equipment Utilized During Treatment: Gait belt Activity Tolerance: Treatment limited secondary to medical complications (Comment) (slow regression of anesthesia with reports of B LE abn senstation with numbness and tingling) Patient left: in chair;with call bell/phone within reach Nurse Communication: Mobility status PT Visit Diagnosis: Unsteadiness on feet (R26.81);Other abnormalities of gait and mobility (R26.89);Muscle weakness (generalized) (M62.81);Difficulty in walking, not elsewhere classified (R26.2);Pain Pain - Right/Left: Left Pain - part of body: Leg;Hip    Time: 7253-6644 PT  Time Calculation (min) (ACUTE ONLY): 26 min   Charges:   PT Evaluation $PT Eval Low Complexity: 1 Low PT Treatments $Therapeutic Activity: 8-22 mins PT General Charges $$ ACUTE PT VISIT: 1 Visit         Johnny Bridge, PT Acute Rehab   Jacqualyn Posey 04/17/2023, 7:12 PM

## 2023-04-17 NOTE — Anesthesia Procedure Notes (Signed)
Spinal  Patient location during procedure: OR Start time: 04/17/2023 1:00 PM End time: 04/17/2023 1:15 PM Reason for block: surgical anesthesia Staffing Performed: anesthesiologist  Anesthesiologist: Lannie Fields, DO Performed by: Lannie Fields, DO Authorized by: Lannie Fields, DO   Preanesthetic Checklist Completed: patient identified, IV checked, risks and benefits discussed, surgical consent, monitors and equipment checked, pre-op evaluation and timeout performed Spinal Block Patient position: sitting Prep: DuraPrep and site prepped and draped Patient monitoring: cardiac monitor, continuous pulse ox and blood pressure Approach: midline Location: L3-4 Injection technique: single-shot Needle Needle type: Pencan  Needle gauge: 24 G Needle length: 9 cm Assessment Sensory level: T6 Events: CSF return and second provider Additional Notes Functioning IV was confirmed and monitors were applied. Sterile prep and drape, including hand hygiene and sterile gloves were used. The patient was positioned and the spine was prepped. The skin was anesthetized with lidocaine.  Free flow of clear CSF was obtained prior to injecting local anesthetic into the CSF.  The spinal needle aspirated freely following injection.  The needle was carefully withdrawn.  The patient tolerated the procedure well.

## 2023-04-17 NOTE — Anesthesia Procedure Notes (Signed)
Procedure Name: MAC Date/Time: 04/17/2023 1:01 PM  Performed by: Elisabeth Cara, CRNAPre-anesthesia Checklist: Patient identified, Emergency Drugs available, Suction available, Patient being monitored and Timeout performed Patient Re-evaluated:Patient Re-evaluated prior to induction Oxygen Delivery Method: Simple face mask Placement Confirmation: positive ETCO2 Dental Injury: Teeth and Oropharynx as per pre-operative assessment

## 2023-04-17 NOTE — Discharge Instructions (Addendum)
Alexis Gross, MD Total Joint Specialist [facility] [address], [address] [place], [place] [address] [phone]  ANTERIOR APPROACH TOTAL HIP REPLACEMENT POSTOPERATIVE DIRECTIONS     Hip Rehabilitation, Guidelines Following Surgery  The results of a hip operation are greatly improved after range of motion and muscle strengthening exercises. Follow all safety measures which are given to protect your hip. If any of these exercises cause increased pain or swelling in your joint, decrease the amount until you are comfortable again. Then slowly increase the exercises. Call your caregiver if you have problems or questions.   BLOOD CLOT PREVENTION Take a 10 mg Xarelto once a day for three weeks following surgery. Then resume an 81 mg Aspirin once a day. You may resume your vitamins/supplements once you have discontinued the Xarelto. Do not take any NSAIDs (Advil, Aleve, Ibuprofen, Meloxicam, etc.) until you have discontinued the Xarelto.    HOME CARE INSTRUCTIONS  Remove items at home which could result in a fall. This includes throw rugs or furniture in walking pathways.  ICE to the affected hip as frequently as 20-30 minutes an hour and then as needed for pain and swelling. Continue to use ice on the hip for pain and swelling from surgery. You may notice swelling that will progress down to the foot and ankle. This is normal after surgery. Elevate the leg when you are not up walking on it.   Continue to use the breathing machine which will help keep your temperature down.  It is common for your temperature to cycle up and down following surgery, especially at night when you are not up moving around and exerting yourself.  The breathing machine keeps your lungs expanded and your temperature down.  DIET You may resume your previous home diet once your are discharged from the hospital.  DRESSING / WOUND CARE / SHOWERING You have an adhesive waterproof bandage over the  incision. Leave this in place until your first follow-up appointment. Once you remove this you will not need to place another bandage.  You may begin showering 3 days following surgery, but do not submerge the incision under water.  ACTIVITY For the first 3-5 days, it is important to rest and keep the operative leg elevated. You should, as a general rule, rest for 50 minutes and walk/stretch for 10 minutes per hour. After 5 days, you may slowly increase activity as tolerated.  Perform the exercises you were provided twice a day for about 15-20 minutes each session. Begin these 2 days following surgery. Walk with your walker as instructed. Use the walker until you are comfortable transitioning to a cane. Walk with the cane in the opposite hand of the operative leg. You may discontinue the cane once you are comfortable and walking steadily. Avoid periods of inactivity such as sitting longer than an hour when not asleep. This helps prevent blood clots.  Do not drive a car for 6 weeks or until released by your surgeon.  Do not drive while taking narcotics.  TED HOSE STOCKINGS Wear the elastic stockings on both legs for three weeks following surgery during the day. You may remove them at night while sleeping.  WEIGHT BEARING Weight bearing as tolerated with assist device (walker, cane, etc) as directed, use it as long as suggested by your surgeon or therapist, typically at least 4-6 weeks.  POSTOPERATIVE CONSTIPATION PROTOCOL Constipation - defined medically as fewer than three stools per week and severe constipation as less than one stool per week.  One of the most common issues patients have following surgery is constipation.  Even if you have a regular bowel pattern at home, your normal regimen is likely to be disrupted due to multiple reasons following surgery.  Combination of anesthesia, postoperative narcotics, change in appetite and fluid intake all can affect your bowels.  In order to avoid  complications following surgery, here are some recommendations in order to help you during your recovery period.  Colace (docusate) - Pick up an over-the-counter form of Colace or another stool softener and take twice a day as long as you are requiring postoperative pain medications.  Take with a full glass of water daily.  If you experience loose stools or diarrhea, hold the colace until you stool forms back up.  If your symptoms do not get better within 1 week or if they get worse, check with your doctor. Dulcolax (bisacodyl) - Pick up over-the-counter and take as directed by the product packaging as needed to assist with the movement of your bowels.  Take with a full glass of water.  Use this product as needed if not relieved by Colace only.  MiraLax (polyethylene glycol) - Pick up over-the-counter to have on hand.  MiraLax is a solution that will increase the amount of water in your bowels to assist with bowel movements.  Take as directed and can mix with a glass of water, juice, soda, coffee, or tea.  Take if you go more than two days without a movement.Do not use MiraLax more than once per day. Call your doctor if you are still constipated or irregular after using this medication for 7 days in a row.  If you continue to have problems with postoperative constipation, please contact the office for further assistance and recommendations.  If you experience "the worst abdominal pain ever" or develop nausea or vomiting, please contact the office immediatly for further recommendations for treatment.  ITCHING  If you experience itching with your medications, try taking only a single pain pill, or even half a pain pill at a time.  You can also use Benadryl over the counter for itching or also to help with sleep.   MEDICATIONS See your medication summary on the "After Visit Summary" that the nursing staff will review with you prior to discharge.  You may have some home medications which will be placed on  hold until you complete the course of blood thinner medication.  It is important for you to complete the blood thinner medication as prescribed by your surgeon.  Continue your approved medications as instructed at time of discharge.  PRECAUTIONS If you experience chest pain or shortness of breath - call 911 immediately for transfer to the hospital emergency department.  If you develop a fever greater that 101 F, purulent drainage from wound, increased redness or drainage from wound, foul odor from the wound/dressing, or calf pain - CONTACT YOUR SURGEON.                                                   FOLLOW-UP APPOINTMENTS Make sure you keep all of your appointments after your operation with your surgeon and caregivers. You should call the office at the above phone number and make an appointment for approximately two weeks after the date of your surgery or on the date instructed by your surgeon outlined in  the "After Visit Summary".  RANGE OF MOTION AND STRENGTHENING EXERCISES  These exercises are designed to help you keep full movement of your hip joint. Follow your caregiver's or physical therapist's instructions. Perform all exercises about fifteen times, three times per day or as directed. Exercise both hips, even if you have had only one joint replacement. These exercises can be done on a training (exercise) mat, on the floor, on a table or on a bed. Use whatever works the best and is most comfortable for you. Use music or television while you are exercising so that the exercises are a pleasant break in your day. This will make your life better with the exercises acting as a break in routine you can look forward to.  Lying on your back, slowly slide your foot toward your buttocks, raising your knee up off the floor. Then slowly slide your foot back down until your leg is straight again.  Lying on your back spread your legs as far apart as you can without causing discomfort.  Lying on your side,  raise your upper leg and foot straight up from the floor as far as is comfortable. Slowly lower the leg and repeat.  Lying on your back, tighten up the muscle in the front of your thigh (quadriceps muscles). You can do this by keeping your leg straight and trying to raise your heel off the floor. This helps strengthen the largest muscle supporting your knee.  Lying on your back, tighten up the muscles of your buttocks both with the legs straight and with the knee bent at a comfortable angle while keeping your heel on the floor.   POST-OPERATIVE OPIOID TAPER INSTRUCTIONS: It is important to wean off of your opioid medication as soon as possible. If you do not need pain medication after your surgery it is ok to stop day one. Opioids include: Codeine, Hydrocodone(Norco, Vicodin), Oxycodone(Percocet, oxycontin) and hydromorphone amongst others.  Long term and even short term use of opiods can cause: Increased pain response Dependence Constipation Depression Respiratory depression And more.  Withdrawal symptoms can include Flu like symptoms Nausea, vomiting And more Techniques to manage these symptoms Hydrate well Eat regular healthy meals Stay active Use relaxation techniques(deep breathing, meditating, yoga) Do Not substitute Alcohol to help with tapering If you have been on opioids for less than two weeks and do not have pain than it is ok to stop all together.  Plan to wean off of opioids This plan should start within one week post op of your joint replacement. Maintain the same interval or time between taking each dose and first decrease the dose.  Cut the total daily intake of opioids by one tablet each day Next start to increase the time between doses. The last dose that should be eliminated is the evening dose.   IF YOU ARE TRANSFERRED TO A SKILLED REHAB FACILITY If the patient is transferred to a skilled rehab facility following release from the hospital, a list of the current  medications will be sent to the facility for the patient to continue.  When discharged from the skilled rehab facility, please have the facility set up the patient's Home Health Physical Therapy prior to being released. Also, the skilled facility will be responsible for providing the patient with their medications at time of release from the facility to include their pain medication, the muscle relaxants, and their blood thinner medication. If the patient is still at the rehab facility at time of the two week follow  up appointment, the skilled rehab facility will also need to assist the patient in arranging follow up appointment in our office and any transportation needs.  MAKE SURE YOU:  Understand these instructions.  Get help right away if you are not doing well or get worse.    DENTAL ANTIBIOTICS:  In most cases prophylactic antibiotics for Dental procdeures after total joint surgery are not necessary.  Exceptions are as follows:  1. History of prior total joint infection  2. Severely immunocompromised (Organ Transplant, cancer chemotherapy, Rheumatoid biologic meds such as Humera)  3. Poorly controlled diabetes (A1C &gt; 8.0, blood glucose over 200)  If you have one of these conditions, contact your surgeon for an antibiotic prescription, prior to your dental procedure.    Pick up stool softner and laxative for home use following surgery while on pain medications. Do not submerge incision under water. Please use good hand washing techniques while changing dressing each day. May shower starting three days after surgery. Please use a clean towel to pat the incision dry following showers. Continue to use ice for pain and swelling after surgery. Do not use any lotions or creams on the incision until instructed by your surgeon.     Information on my medicine - XARELTO (Rivaroxaban)    Why was Xarelto prescribed for you? Xarelto was prescribed for you to reduce the risk of  blood clots forming after orthopedic surgery. The medical term for these abnormal blood clots is venous thromboembolism (VTE).  What do you need to know about xarelto ? Take your Xarelto ONCE DAILY at the same time every day. You may take it either with or without food.  If you have difficulty swallowing the tablet whole, you may crush it and mix in applesauce just prior to taking your dose.  Take Xarelto exactly as prescribed by your doctor and DO NOT stop taking Xarelto without talking to the doctor who prescribed the medication.  Stopping without other VTE prevention medication to take the place of Xarelto may increase your risk of developing a clot.  After discharge, you should have regular check-up appointments with your healthcare provider that is prescribing your Xarelto.    What do you do if you miss a dose? If you miss a dose, take it as soon as you remember on the same day then continue your regularly scheduled once daily regimen the next day. Do not take two doses of Xarelto on the same day.   Important Safety Information A possible side effect of Xarelto is bleeding. You should call your healthcare provider right away if you experience any of the following: Bleeding from an injury or your nose that does not stop. Unusual colored urine (red or dark brown) or unusual colored stools (red or black). Unusual bruising for unknown reasons. A serious fall or if you hit your head (even if there is no bleeding).  Some medicines may interact with Xarelto and might increase your risk of bleeding while on Xarelto. To help avoid this, consult your healthcare provider or pharmacist prior to using any new prescription or non-prescription medications, including herbals, vitamins, non-steroidal anti-inflammatory drugs (NSAIDs) and supplements.  This website has more information on Xarelto: VisitDestination.com.br.

## 2023-04-17 NOTE — Interval H&P Note (Signed)
History and Physical Interval Note:  04/17/2023 10:43 AM  Alexis Barnes  has presented today for surgery, with the diagnosis of left hip osteoarthritis.  The various methods of treatment have been discussed with the patient and family. After consideration of risks, benefits and other options for treatment, the patient has consented to  Procedure(s): TOTAL HIP ARTHROPLASTY ANTERIOR APPROACH (Left) as a surgical intervention.  The patient's history has been reviewed, patient examined, no change in status, stable for surgery.  I have reviewed the patient's chart and labs.  Questions were answered to the patient's satisfaction.     Homero Fellers Shalev Helminiak

## 2023-04-18 ENCOUNTER — Encounter (HOSPITAL_COMMUNITY): Payer: Self-pay | Admitting: Orthopedic Surgery

## 2023-04-18 DIAGNOSIS — Z8249 Family history of ischemic heart disease and other diseases of the circulatory system: Secondary | ICD-10-CM | POA: Diagnosis not present

## 2023-04-18 DIAGNOSIS — Z888 Allergy status to other drugs, medicaments and biological substances status: Secondary | ICD-10-CM | POA: Diagnosis not present

## 2023-04-18 DIAGNOSIS — K219 Gastro-esophageal reflux disease without esophagitis: Secondary | ICD-10-CM | POA: Diagnosis present

## 2023-04-18 DIAGNOSIS — I10 Essential (primary) hypertension: Secondary | ICD-10-CM | POA: Diagnosis present

## 2023-04-18 DIAGNOSIS — E785 Hyperlipidemia, unspecified: Secondary | ICD-10-CM | POA: Diagnosis present

## 2023-04-18 DIAGNOSIS — M1612 Unilateral primary osteoarthritis, left hip: Secondary | ICD-10-CM | POA: Diagnosis present

## 2023-04-18 DIAGNOSIS — Z800 Family history of malignant neoplasm of digestive organs: Secondary | ICD-10-CM | POA: Diagnosis not present

## 2023-04-18 DIAGNOSIS — M797 Fibromyalgia: Secondary | ICD-10-CM | POA: Diagnosis present

## 2023-04-18 DIAGNOSIS — F419 Anxiety disorder, unspecified: Secondary | ICD-10-CM | POA: Diagnosis present

## 2023-04-18 DIAGNOSIS — F32A Depression, unspecified: Secondary | ICD-10-CM | POA: Diagnosis present

## 2023-04-18 DIAGNOSIS — Z7982 Long term (current) use of aspirin: Secondary | ICD-10-CM | POA: Diagnosis not present

## 2023-04-18 DIAGNOSIS — Z79899 Other long term (current) drug therapy: Secondary | ICD-10-CM | POA: Diagnosis not present

## 2023-04-18 DIAGNOSIS — Z8673 Personal history of transient ischemic attack (TIA), and cerebral infarction without residual deficits: Secondary | ICD-10-CM | POA: Diagnosis not present

## 2023-04-18 LAB — BASIC METABOLIC PANEL
Anion gap: 9 (ref 5–15)
BUN: 12 mg/dL (ref 8–23)
CO2: 24 mmol/L (ref 22–32)
Calcium: 8.7 mg/dL — ABNORMAL LOW (ref 8.9–10.3)
Chloride: 102 mmol/L (ref 98–111)
Creatinine, Ser: 0.72 mg/dL (ref 0.44–1.00)
GFR, Estimated: >60 mL/min (ref >60)
Glucose, Bld: 163 mg/dL — ABNORMAL HIGH (ref 70–99)
Potassium: 4.2 mmol/L (ref 3.5–5.1)
Sodium: 135 mmol/L (ref 135–145)

## 2023-04-18 LAB — CBC
HCT: 34.4 % — ABNORMAL LOW (ref 36.0–46.0)
Hemoglobin: 11.5 g/dL — ABNORMAL LOW (ref 12.0–15.0)
MCH: 32.2 pg (ref 26.0–34.0)
MCHC: 33.4 g/dL (ref 30.0–36.0)
MCV: 96.4 fL (ref 80.0–100.0)
Platelets: 387 10*3/uL (ref 150–400)
RBC: 3.57 MIL/uL — ABNORMAL LOW (ref 3.87–5.11)
RDW: 12.4 % (ref 11.5–15.5)
WBC: 17.1 10*3/uL — ABNORMAL HIGH (ref 4.0–10.5)
nRBC: 0 % (ref 0.0–0.2)

## 2023-04-18 NOTE — Plan of Care (Signed)

## 2023-04-18 NOTE — Progress Notes (Signed)
   Subjective: 1 Day Post-Op Procedure(s) (LRB): TOTAL HIP ARTHROPLASTY ANTERIOR APPROACH (Left) Patient reports pain as  mild to moderate .   Patient seen in rounds by Dr. Lequita Halt. Patient has no complaints other than soreness in the left hip. No issues overnight. Foley catheter removed this AM. We will continue therapy today, did not ambulate yesterday.  Objective: Vital signs in last 24 hours: Temp:  [97.4 F (36.3 C)-98.3 F (36.8 C)] 98.3 F (36.8 C) (09/24 0645) Pulse Rate:  [73-98] 98 (09/24 0645) Resp:  [14-19] 16 (09/24 0645) BP: (112-153)/(48-71) 149/58 (09/24 0645) SpO2:  [93 %-100 %] 98 % (09/24 0645) Weight:  [72.6 kg] 72.6 kg (09/23 1050)  Intake/Output from previous day:  Intake/Output Summary (Last 24 hours) at 04/18/2023 0825 Last data filed at 04/18/2023 0806 Gross per 24 hour  Intake 3170.87 ml  Output 2600 ml  Net 570.87 ml     Intake/Output this shift: Total I/O In: 157.5 [I.V.:157.5] Out: -   Labs: Recent Labs    04/18/23 0248  HGB 11.5*   Recent Labs    04/18/23 0248  WBC 17.1*  RBC 3.57*  HCT 34.4*  PLT 387   Recent Labs    04/18/23 0248  NA 135  K 4.2  CL 102  CO2 24  BUN 12  CREATININE 0.72  GLUCOSE 163*  CALCIUM 8.7*   No results for input(s): "LABPT", "INR" in the last 72 hours.  Exam: General - Patient is Alert and Oriented Extremity - Neurologically intact Neurovascular intact Sensation intact distally Dorsiflexion/Plantar flexion intact Dressing - dressing C/D/I Motor Function - intact, moving foot and toes well on exam.   Past Medical History:  Diagnosis Date   Anxiety    Arthritis    Depression    Essential hypertension    Fibromyalgia    GERD (gastroesophageal reflux disease)    Hyperlipidemia    Infectious colitis    Associated with syncope and sepsis, managed at [facility] January 2015   Pneumonia    Postherpetic neuralgia    Stroke (HCC)    Vertigo     Assessment/Plan: 1 Day Post-Op Procedure(s)  (LRB): TOTAL HIP ARTHROPLASTY ANTERIOR APPROACH (Left) Principal Problem:   OA (osteoarthritis) of hip Active Problems:   Osteoarthritis of left hip  Estimated body mass index is 28.34 kg/m as calculated from the following:   Height as of this encounter: 5\' 3"  (1.6 m).   Weight as of this encounter: 72.6 kg. Advance diet Up with therapy D/C IV fluids  DVT Prophylaxis - Xarelto Weight bearing as tolerated. Continue therapy.  Plan is to go Home after hospital stay. Possible discharge tomorrow pending therapy progression. Mobility will be slow due to deconditioned state prior to surgery.  Arther Abbott, PA-C Orthopedic Surgery [phone] 04/18/2023, 8:25 AM

## 2023-04-18 NOTE — Care Management Obs Status (Signed)
MEDICARE OBSERVATION STATUS NOTIFICATION   Patient Details  Name: Alexis Barnes MRN: [id number] Date of Birth: 1945/10/02   Medicare Observation Status Notification Given:  Yes    Ewing Schlein, LCSW 04/18/2023, 11:18 AM

## 2023-04-18 NOTE — TOC Transition Note (Signed)
Transition of Care [facility]) - CM/SW Discharge Note  Patient Details  Name: Alexis Barnes MRN: [id number] Date of Birth: 1945/12/28  [facility]) CM/SW Contact:  Ewing Schlein, LCSW Phone Number: 04/18/2023, 10:36 AM  Clinical Narrative: Patient is expected to discharge home after working with PT. CSW met with patient to confirm discharge plan and needs. Patient will go home with a home exercise program (HEP). Patient will need a rolling walker, which [employer] delivered to patient's room. TOC signing off.  Final next level of care: Home/Self Care Barriers to Discharge: No Barriers Identified  Patient Goals and CMS Choice CMS Medicare.gov Compare Post Acute Care list provided to:: Patient Choice offered to / list presented to : Patient  Discharge Plan and Services Additional resources added to the After Visit Summary for        DME Arranged: Walker rolling DME Agency: [employer] Date DME Agency Contacted: 04/18/23 Representative spoke with at DME Agency: Loraine Leriche  Social Determinants of Health (SDOH) Interventions SDOH Screenings   Food Insecurity: No Food Insecurity (04/17/2023)  Housing: Low Risk  (04/17/2023)  Transportation Needs: No Transportation Needs (04/17/2023)  Utilities: Not At Risk (04/17/2023)  Social Connections: Unknown (01/02/2023)   Received from [facility], [facility]  Tobacco Use: Low Risk  (04/17/2023)  Health Literacy: Low Risk  (10/20/2022)   [facility], [facility]   Readmission Risk Interventions     No data to display

## 2023-04-18 NOTE — Progress Notes (Signed)
Physical Therapy Treatment Patient Details Name: Alexis Barnes MRN: [id number] DOB: [birth year] Today's Date: 04/18/2023   History of Present Illness 77 yo female presents to therapy s/p L THA, anterior approach on 04/17/2023 due to failure of conservative measures. Pt PMH includes but is not limited to: CVA (2021 and 2023), colitis, vertigo, HTN, fibromyalgia, GERD, and R RTC surgery.    PT Comments  Pt progressing well. Reports pain as controlled. Will plan to practice stair step negotiation on tomorrow prior to d/c home.     If plan is discharge home, recommend the following: A little help with walking and/or transfers;A little help with bathing/dressing/bathroom;Assistance with cooking/housework;Assist for transportation;Help with stairs or ramp for entrance   Can travel by private vehicle        Equipment Recommendations  Rolling walker (2 wheels)    Recommendations for Other Services       Precautions / Restrictions Precautions Precautions: Fall Restrictions Weight Bearing Restrictions: No LLE Weight Bearing: Weight bearing as tolerated     Mobility  Bed Mobility Overal bed mobility: Needs Assistance Bed Mobility: Supine to Sit, Sit to Supine     Supine to sit: Contact guard Sit to supine: Contact guard assist   General bed mobility comments: used gait belt to assist L LE back onto bed. increased time. cues provided.    Transfers Overall transfer level: Needs assistance Equipment used: Rolling walker (2 wheels) Transfers: Sit to/from Stand Sit to Stand: Supervision           General transfer comment: Cues for safety, hand placemenet    Ambulation/Gait Ambulation/Gait assistance: Contact guard assist Gait Distance (Feet): 125 Feet Assistive device: Rolling walker (2 wheels) Gait Pattern/deviations: Step-through pattern, Decreased stride length, Antalgic       General Gait Details: Gait mildly antalgic. Slow gait speed. No LOB with RW use.  Tolerated distance well.   Stairs             Wheelchair Mobility     Tilt Bed    Modified Rankin (Stroke Patients Only)       Balance Overall balance assessment: Needs assistance         Standing balance support: Bilateral upper extremity supported, During functional activity, Reliant on assistive device for balance Standing balance-Leahy Scale: Fair                              Cognition Arousal: Alert Behavior During Therapy: WFL for tasks assessed/performed Overall Cognitive Status: Within Functional Limits for tasks assessed                                          Exercises     General Comments        Pertinent Vitals/Pain Pain Assessment Pain Assessment: Faces Faces Pain Scale: Hurts little more Pain Location: L hip/thigh Pain Descriptors / Indicators: Discomfort, Sore Pain Intervention(s): Monitored during session, Repositioned    Home Living                          Prior Function            PT Goals (current goals can now be found in the care plan section) Progress towards PT goals: Progressing toward goals    Frequency    7X/week  PT Plan      Co-evaluation              AM-PAC PT "6 Clicks" Mobility   Outcome Measure  Help needed turning from your back to your side while in a flat bed without using bedrails?: A Little Help needed moving from lying on your back to sitting on the side of a flat bed without using bedrails?: A Little Help needed moving to and from a bed to a chair (including a wheelchair)?: A Little Help needed standing up from a chair using your arms (e.g., wheelchair or bedside chair)?: A Little Help needed to walk in hospital room?: A Little Help needed climbing 3-5 steps with a railing? : A Little 6 Click Score: 18    End of Session Equipment Utilized During Treatment: Gait belt Activity Tolerance: Patient tolerated treatment well Patient left: in  bed;with call bell/phone within reach;with bed alarm set   PT Visit Diagnosis: Unsteadiness on feet (R26.81);Other abnormalities of gait and mobility (R26.89);Muscle weakness (generalized) (M62.81);Difficulty in walking, not elsewhere classified (R26.2);Pain Pain - Right/Left: Left Pain - part of body: Hip     Time: 1308-6578 PT Time Calculation (min) (ACUTE ONLY): 18 min  Charges:    $Gait Training: 8-22 mins PT General Charges $$ ACUTE PT VISIT: 1 Visit                         Faye Ramsay, PT Acute Rehabilitation  Office: [phone]

## 2023-04-18 NOTE — Progress Notes (Signed)
Physical Therapy Treatment Patient Details Name: Alexis Barnes MRN: [id number] DOB: 1945/09/21 Today's Date: 04/18/2023   History of Present Illness 77 yo female presents to therapy s/p L THA, anterior approach on 04/17/2023 due to failure of conservative measures. Pt PMH includes but is not limited to: CVA (2021 and 2023), colitis, vertigo, HTN, fibromyalgia, GERD, and R RTC surgery.    PT Comments  Pt agreeable to therapy. Reports pain as controlled. Tolerated increased activity well. Will continue to follow and progress activity as tolerated.     If plan is discharge home, recommend the following: A little help with walking and/or transfers;A little help with bathing/dressing/bathroom;Assistance with cooking/housework;Assist for transportation;Help with stairs or ramp for entrance   Can travel by private vehicle        Equipment Recommendations  Rolling walker (2 wheels)    Recommendations for Other Services       Precautions / Restrictions Precautions Precautions: Fall Restrictions LLE Weight Bearing: Weight bearing as tolerated     Mobility  Bed Mobility               General bed mobility comments: oob in recliner    Transfers Overall transfer level: Needs assistance Equipment used: Rolling walker (2 wheels) Transfers: Sit to/from Stand Sit to Stand: Contact guard assist           General transfer comment: Cues for safety, hand placemenet    Ambulation/Gait Ambulation/Gait assistance: Contact guard assist Gait Distance (Feet): 100 Feet Assistive device: Rolling walker (2 wheels) Gait Pattern/deviations: Step-through pattern, Decreased stride length, Antalgic       General Gait Details: Gait mildly antalgic. Slow gait speed. No LOb with RW use   Stairs             Wheelchair Mobility     Tilt Bed    Modified Rankin (Stroke Patients Only)       Balance Overall balance assessment: Needs assistance         Standing balance  support: Bilateral upper extremity supported, During functional activity, Reliant on assistive device for balance Standing balance-Leahy Scale: Fair                              Cognition Arousal: Alert Behavior During Therapy: WFL for tasks assessed/performed Overall Cognitive Status: Within Functional Limits for tasks assessed                                          Exercises Total Joint Exercises Ankle Circles/Pumps: AROM, Both, 10 reps Quad Sets: AROM, Both, 10 reps Heel Slides: AAROM, Left, 10 reps (pt use gait belt to A) Hip ABduction/ADduction: AAROM, Left, 10 reps (pt used gait belt to A)    General Comments        Pertinent Vitals/Pain Pain Assessment Pain Assessment: Faces Faces Pain Scale: Hurts little more Pain Location: L hip/thigh Pain Descriptors / Indicators: Discomfort, Sore Pain Intervention(s): Monitored during session, Ice applied    Home Living                          Prior Function            PT Goals (current goals can now be found in the care plan section) Progress towards PT goals: Progressing toward goals    Frequency  7X/week      PT Plan      Co-evaluation              AM-PAC PT "6 Clicks" Mobility   Outcome Measure  Help needed turning from your back to your side while in a flat bed without using bedrails?: A Little Help needed moving from lying on your back to sitting on the side of a flat bed without using bedrails?: A Little Help needed moving to and from a bed to a chair (including a wheelchair)?: A Little Help needed standing up from a chair using your arms (e.g., wheelchair or bedside chair)?: A Little Help needed to walk in hospital room?: A Little Help needed climbing 3-5 steps with a railing? : A Little 6 Click Score: 18    End of Session Equipment Utilized During Treatment: Gait belt Activity Tolerance: Patient tolerated treatment well Patient left: in chair;with  call bell/phone within reach;with family/visitor present   PT Visit Diagnosis: Unsteadiness on feet (R26.81);Other abnormalities of gait and mobility (R26.89);Muscle weakness (generalized) (M62.81);Difficulty in walking, not elsewhere classified (R26.2);Pain Pain - Right/Left: Left Pain - part of body: Hip     Time: 4782-9562 PT Time Calculation (min) (ACUTE ONLY): 16 min  Charges:    $Gait Training: 8-22 mins PT General Charges $$ ACUTE PT VISIT: 1 Visit                         Faye Ramsay, PT Acute Rehabilitation  Office: [phone]

## 2023-04-18 NOTE — Plan of Care (Signed)
  Problem: Education: Goal: Knowledge of the prescribed therapeutic regimen will improve Outcome: Progressing   Problem: Activity: Goal: Ability to tolerate increased activity will improve Outcome: Progressing   Problem: Clinical Measurements: Goal: Postoperative complications will be avoided or minimized Outcome: Progressing   Problem: Pain Management: Goal: Pain level will decrease with appropriate interventions Outcome: Progressing   Problem: Safety: Goal: Ability to remain free from injury will improve Outcome: Progressing

## 2023-04-19 LAB — CBC
HCT: 34.5 % — ABNORMAL LOW (ref 36.0–46.0)
Hemoglobin: 11.3 g/dL — ABNORMAL LOW (ref 12.0–15.0)
MCH: 32.5 pg (ref 26.0–34.0)
MCHC: 32.8 g/dL (ref 30.0–36.0)
MCV: 99.1 fL (ref 80.0–100.0)
Platelets: 387 10*3/uL (ref 150–400)
RBC: 3.48 MIL/uL — ABNORMAL LOW (ref 3.87–5.11)
RDW: 12.9 % (ref 11.5–15.5)
WBC: 15.8 10*3/uL — ABNORMAL HIGH (ref 4.0–10.5)
nRBC: 0 % (ref 0.0–0.2)

## 2023-04-19 MED ORDER — HYDROCODONE-ACETAMINOPHEN 5-325 MG PO TABS: 1.0000 | ORAL_TABLET | Freq: Four times a day (QID) | ORAL | 0 refills | Status: AC | PRN

## 2023-04-19 MED ORDER — TRAMADOL HCL 50 MG PO TABS: 50.0000 mg | ORAL_TABLET | Freq: Four times a day (QID) | ORAL | 0 refills | Status: AC | PRN

## 2023-04-19 MED ORDER — METHOCARBAMOL 500 MG PO TABS: 500.0000 mg | ORAL_TABLET | Freq: Four times a day (QID) | ORAL | 0 refills | Status: AC | PRN

## 2023-04-19 MED ORDER — ONDANSETRON HCL 4 MG PO TABS: 4.0000 mg | ORAL_TABLET | Freq: Four times a day (QID) | ORAL | 0 refills | Status: AC | PRN

## 2023-04-19 MED ORDER — RIVAROXABAN 10 MG PO TABS: 10.0000 mg | ORAL_TABLET | Freq: Every day | ORAL | 0 refills | Status: AC

## 2023-04-19 NOTE — Progress Notes (Signed)
Discharge package printed and instructions given to pt and daughter.

## 2023-04-19 NOTE — Care Management Important Message (Signed)
Important Message  Patient Details IM Letter given. Name: Alexis Barnes MRN: [id number] Date of Birth: 02/09/1946   Important Message Given:  Yes - Medicare IM     Caren Macadam 04/19/2023, 8:32 AM

## 2023-04-19 NOTE — Progress Notes (Signed)
   Subjective: 2 Days Post-Op Procedure(s) (LRB): TOTAL HIP ARTHROPLASTY ANTERIOR APPROACH (Left) Patient seen in rounds by Dr. Lequita Halt. Patient is well, and has had no acute complaints or problems Denies SOB or chest pain. Denies calf pain. Patient reports pain as moderate. Worked with physical therapy yesterday and ambulated 125'.  Objective: Vital signs in last 24 hours: Temp:  [98.2 F (36.8 C)-98.4 F (36.9 C)] 98.2 F (36.8 C) (09/25 0505) Pulse Rate:  [74-97] 74 (09/25 0505) Resp:  [14-17] 17 (09/25 0505) BP: (120-158)/(53-69) 120/68 (09/25 0505) SpO2:  [95 %-99 %] 99 % (09/25 0505)  Intake/Output from previous day:  Intake/Output Summary (Last 24 hours) at 04/19/2023 0746 Last data filed at 04/19/2023 0600 Gross per 24 hour  Intake 1081.08 ml  Output 1700 ml  Net -618.92 ml    Intake/Output this shift: No intake/output data recorded.  Labs: Recent Labs    04/18/23 0248 04/19/23 0246  HGB 11.5* 11.3*   Recent Labs    04/18/23 0248 04/19/23 0246  WBC 17.1* 15.8*  RBC 3.57* 3.48*  HCT 34.4* 34.5*  PLT 387 387   Recent Labs    04/18/23 0248  NA 135  K 4.2  CL 102  CO2 24  BUN 12  CREATININE 0.72  GLUCOSE 163*  CALCIUM 8.7*   No results for input(s): "LABPT", "INR" in the last 72 hours.  Exam: General - Patient is Alert and Oriented Extremity - Neurologically intact Neurovascular intact Sensation intact distally Dorsiflexion/Plantar flexion intact Dressing/Incision - clean, dry, no drainage Motor Function - intact, moving foot and toes well on exam.  Past Medical History:  Diagnosis Date   Anxiety    Arthritis    Depression    Essential hypertension    Fibromyalgia    GERD (gastroesophageal reflux disease)    Hyperlipidemia    Infectious colitis    Associated with syncope and sepsis, managed at [facility] January 2015   Pneumonia    Postherpetic neuralgia    Stroke (HCC)    Vertigo     Assessment/Plan: 2 Days Post-Op Procedure(s)  (LRB): TOTAL HIP ARTHROPLASTY ANTERIOR APPROACH (Left) Principal Problem:   OA (osteoarthritis) of hip Active Problems:   Osteoarthritis of left hip  Estimated body mass index is 28.34 kg/m as calculated from the following:   Height as of this encounter: 5\' 3"  (1.6 m).   Weight as of this encounter: 72.6 kg.  DVT Prophylaxis - Xarelto Weight-bearing as tolerated.  Continue physical therapy. Expected discharge home today pending patient progress and if meeting goals. Will do HEP once discharged. Follow-up in clinic in 2 weeks.  The PDMP database was reviewed today prior to any opioid medications being prescribed to this patient.  R. Arcola Jansky, PA-C Orthopedic Surgery 04/19/2023, 7:46 AM

## 2023-04-19 NOTE — Plan of Care (Signed)
Problem: Activity: Goal: Ability to avoid complications of mobility impairment will improve Outcome: Progressing Goal: Ability to tolerate increased activity will improve Outcome: Progressing   Problem: Pain Management: Goal: Pain level will decrease with appropriate interventions Outcome: Progressing   Problem: Safety: Goal: Ability to remain free from injury will improve Outcome: Progressing

## 2023-04-19 NOTE — Progress Notes (Signed)
Physical Therapy Treatment Patient Details Name: Alexis Barnes MRN: [id number] DOB: 1946/04/16 Today's Date: 04/19/2023   History of Present Illness 77 yo female presents to therapy s/p L THA, anterior approach on 04/17/2023 due to failure of conservative measures. Pt PMH includes but is not limited to: CVA (2021 and 2023), colitis, vertigo, HTN, fibromyalgia, GERD, and R RTC surgery.    PT Comments  Session with patient and daughter. Issued HEP for pt to follow at home. Encouraged her to ambulate often as tolerated. All PT education completed.      If plan is discharge home, recommend the following: A little help with walking and/or transfers;A little help with bathing/dressing/bathroom;Assistance with cooking/housework;Assist for transportation;Help with stairs or ramp for entrance   Can travel by private vehicle        Equipment Recommendations  Rolling walker (2 wheels)    Recommendations for Other Services       Precautions / Restrictions Precautions Precautions: Fall Restrictions Weight Bearing Restrictions: No LLE Weight Bearing: Weight bearing as tolerated     Mobility  Bed Mobility Overal bed mobility: Needs Assistance Bed Mobility: Supine to Sit, Sit to Supine     Supine to sit: Contact guard Sit to supine: Contact guard assist   General bed mobility comments: used gait belt to assist L LE back onto bed. increased time. cues provided.    Transfers Overall transfer level: Needs assistance Equipment used: Rolling walker (2 wheels) Transfers: Sit to/from Stand Sit to Stand: Supervision           General transfer comment: Cues for safety, hand placement    Ambulation/Gait Ambulation/Gait assistance: Supervision Gait Distance (Feet): 125 Feet Assistive device: Rolling walker (2 wheels) Gait Pattern/deviations: Step-through pattern, Decreased stride length       General Gait Details: Gait mildly antalgic. Slow gait speed. No LOB with RW use.  Tolerated distance well.   Stairs Stairs: Yes Stairs assistance: Supervision Stair Management: Step to pattern, Forwards, With walker Number of Stairs: 1 General stair comments: up and over curb step. cues for safety, technique, sequence   Wheelchair Mobility     Tilt Bed    Modified Rankin (Stroke Patients Only)       Balance Overall balance assessment: Needs assistance         Standing balance support: Bilateral upper extremity supported, During functional activity, Reliant on assistive device for balance Standing balance-Leahy Scale: Fair                              Cognition Arousal: Alert Behavior During Therapy: WFL for tasks assessed/performed Overall Cognitive Status: Within Functional Limits for tasks assessed                                          Exercises Total Joint Exercises Ankle Circles/Pumps: AROM, Both, 10 reps Quad Sets: AROM, Both, 10 reps Heel Slides: AAROM, Left, 10 reps Hip ABduction/ADduction: AAROM, Left, 10 reps Knee Flexion: AROM Marching in Standing: AROM, Left, 10 reps, Standing    General Comments        Pertinent Vitals/Pain Pain Assessment Pain Assessment: Faces Faces Pain Scale: Hurts little more Pain Location: L hip/thigh Pain Descriptors / Indicators: Discomfort, Sore Pain Intervention(s): Monitored during session, Repositioned, Ice applied    Home Living  Prior Function            PT Goals (current goals can now be found in the care plan section) Progress towards PT goals: Progressing toward goals    Frequency    7X/week      PT Plan      Co-evaluation              AM-PAC PT "6 Clicks" Mobility   Outcome Measure  Help needed turning from your back to your side while in a flat bed without using bedrails?: A Little Help needed moving from lying on your back to sitting on the side of a flat bed without using bedrails?: A  Little Help needed moving to and from a bed to a chair (including a wheelchair)?: A Little Help needed standing up from a chair using your arms (e.g., wheelchair or bedside chair)?: A Little Help needed to walk in hospital room?: A Little Help needed climbing 3-5 steps with a railing? : A Little 6 Click Score: 18    End of Session Equipment Utilized During Treatment: Gait belt Activity Tolerance: Patient tolerated treatment well Patient left: in chair;with call bell/phone within reach;with family/visitor present   PT Visit Diagnosis: Unsteadiness on feet (R26.81);Other abnormalities of gait and mobility (R26.89);Muscle weakness (generalized) (M62.81);Difficulty in walking, not elsewhere classified (R26.2);Pain Pain - Right/Left: Left Pain - part of body: Hip     Time: 1610-9604 PT Time Calculation (min) (ACUTE ONLY): 37 min  Charges:    $Gait Training: 23-37 mins PT General Charges $$ ACUTE PT VISIT: 1 Visit                         Faye Ramsay, PT Acute Rehabilitation  Office: [phone]

## 2023-04-20 NOTE — Discharge Summary (Signed)
Physician Discharge Summary   Patient ID: Alexis Barnes MRN: [id number] DOB/AGE: 09-26-1945 77 y.o.  Admit date: 04/17/2023 Discharge date: 04/19/2023  Primary Diagnosis: Osteoarthritis of the left hip    Admission Diagnoses:  Past Medical History:  Diagnosis Date   Anxiety    Arthritis    Depression    Essential hypertension    Fibromyalgia    GERD (gastroesophageal reflux disease)    Hyperlipidemia    Infectious colitis    Associated with syncope and sepsis, managed at [facility] January 2015   Pneumonia    Postherpetic neuralgia    Stroke [facility])    Vertigo    Discharge Diagnoses:   Principal Problem:   OA (osteoarthritis) of hip Active Problems:   Osteoarthritis of left hip  Estimated body mass index is 28.34 kg/m as calculated from the following:   Height as of this encounter: 5\' 3"  (1.6 m).   Weight as of this encounter: 72.6 kg.  Procedure:  Procedure(s) (LRB): TOTAL HIP ARTHROPLASTY ANTERIOR APPROACH (Left)   Consults: None  HPI: Alexis Barnes is a 77 y.o. female who has advanced end- stage arthritis of their Left  hip with progressively worsening pain and dysfunction.The patient has failed nonoperative management and presents for  total hip arthroplasty.  Laboratory Data: Admission on 04/17/2023, Discharged on 04/19/2023  Component Date Value Ref Range Status   ABO/RH(D) 04/17/2023    Final                   Value:A NEG Performed at [facility], [address] [place], [place] [address]    WBC 04/18/2023 17.1 (H)  4.0 - 10.5 K/uL Final   RBC 04/18/2023 3.57 (L)  3.87 - 5.11 MIL/uL Final   Hemoglobin 04/18/2023 11.5 (L)  12.0 - 15.0 g/dL Final   HCT 25/36/6440 34.4 (L)  36.0 - 46.0 % Final   MCV 04/18/2023 96.4  80.0 - 100.0 fL Final   MCH 04/18/2023 32.2  26.0 - 34.0 pg Final   MCHC 04/18/2023 33.4  30.0 - 36.0 g/dL Final   RDW 34/74/2595 12.4  11.5 - 15.5 % Final   Platelets 04/18/2023 387  150 - 400 K/uL Final   nRBC  04/18/2023 0.0  0.0 - 0.2 % Final   Performed at [facility], [address] [place], [place] [address]   Sodium 04/18/2023 135  135 - 145 mmol/L Final   Potassium 04/18/2023 4.2  3.5 - 5.1 mmol/L Final   Chloride 04/18/2023 102  98 - 111 mmol/L Final   CO2 04/18/2023 24  22 - 32 mmol/L Final   Glucose, Bld 04/18/2023 163 (H)  70 - 99 mg/dL Final   Glucose reference range applies only to samples taken after fasting for at least 8 hours.   BUN 04/18/2023 12  8 - 23 mg/dL Final   Creatinine, Ser 04/18/2023 0.72  0.44 - 1.00 mg/dL Final   Calcium 64/33/2951 8.7 (L)  8.9 - 10.3 mg/dL Final   GFR, Estimated 04/18/2023 >60  >60 mL/min Final   Comment: (NOTE) Calculated using the CKD-EPI Creatinine Equation (2021)    Anion gap 04/18/2023 9  5 - 15 Final   Performed at [facility], [address], [place], [place] [address]   WBC 04/19/2023 15.8 (H)  4.0 - 10.5 K/uL Final   RBC 04/19/2023 3.48 (L)  3.87 - 5.11 MIL/uL Final   Hemoglobin 04/19/2023 11.3 (L)  12.0 - 15.0 g/dL Final  HCT 04/19/2023 34.5 (L)  36.0 - 46.0 % Final   MCV 04/19/2023 99.1  80.0 - 100.0 fL Final   MCH 04/19/2023 32.5  26.0 - 34.0 pg Final   MCHC 04/19/2023 32.8  30.0 - 36.0 g/dL Final   RDW 16/04/9603 12.9  11.5 - 15.5 % Final   Platelets 04/19/2023 387  150 - 400 K/uL Final   nRBC 04/19/2023 0.0  0.0 - 0.2 % Final   Performed at [facility], [address] [place], [place] [address]  Hospital Outpatient Visit on 04/07/2023  Component Date Value Ref Range Status   MRSA, PCR 04/07/2023 NEGATIVE  NEGATIVE Final   Staphylococcus aureus 04/07/2023 NEGATIVE  NEGATIVE Final   Comment: (NOTE) The Xpert SA Assay (FDA approved for NASAL specimens in patients 27 years of age and older), is one component of a comprehensive surveillance program. It is not intended to diagnose infection nor to guide or monitor treatment. Performed at [facility],  [address], [place], [place] [address]    ABO/RH(D) 04/07/2023 A NEG   Final   Antibody Screen 04/07/2023 NEG   Final   Sample Expiration 04/07/2023 04/20/2023,2359   Final   Extend sample reason 04/07/2023    Final                   Value:NO TRANSFUSIONS OR PREGNANCY IN THE PAST 3 MONTHS Performed at [facility], [address] [place], [place] [address]      X-Rays:DG HIP UNILAT WITH PELVIS 1V LEFT  Result Date: 04/17/2023 CLINICAL DATA:  Elective surgery. EXAM: DG HIP (WITH OR WITHOUT PELVIS) 1V*L* COMPARISON:  None Available. FINDINGS: Two fluoroscopic spot views of the pelvis and left hip obtained in the operating room. Images during hip arthroplasty. Fluoroscopy time 12 seconds. Dose 1.275 mGy. IMPRESSION: Intraoperative fluoroscopy for left hip arthroplasty. Electronically Signed   By: Narda Rutherford M.D.   On: 04/17/2023 16:41   DG C-Arm 1-60 Min-No Report  Result Date: 04/17/2023 Fluoroscopy was utilized by the requesting physician.  No radiographic interpretation.    EKG: Orders placed or performed during the hospital encounter of 07/14/16   EKG 12-Lead   EKG 12-Lead   EKG     Hospital Course: Alexis Barnes is a 77 y.o. who was admitted to [facility]. They were brought to the operating room on 04/17/2023 and underwent Procedure(s): TOTAL HIP ARTHROPLASTY ANTERIOR APPROACH.  Patient tolerated the procedure well and was later transferred to the recovery room and then to the orthopaedic floor for postoperative care. They were given PO and IV analgesics for pain control following their surgery. They were given 24 hours of postoperative antibiotics of  Anti-infectives (From admission, onward)    Start     Dose/Rate Route Frequency Ordered Stop   04/17/23 1915  ceFAZolin (ANCEF) IVPB 2g/100 mL premix        2 g 200 mL/hr over 30 Minutes Intravenous Every 6 hours 04/17/23 1642 04/18/23 0718   04/17/23 1030  ceFAZolin (ANCEF) IVPB 2g/100  mL premix        2 g 200 mL/hr over 30 Minutes Intravenous On call to O.R. 04/17/23 1020 04/17/23 1346     and started on DVT prophylaxis in the form of Xarelto.   PT and OT were ordered for total joint protocol. Discharge planning consulted to help with post-op disposition and equipment needs. Patient had a fair night on the evening of surgery. They started  to get up OOB with physical therapy on POD #0. Continued to work with physical therapy into POD #2. Patient was seen during rounds on day two and was ready to go home pending progress with physical therapy. Patient worked with physical therapy for a total of 4 sessions and was meeting their goals. Dressing was changed and the incision was C/D/I.  They were discharged home later that day in stable condition.  Diet: Regular diet Activity: WBAT Follow-up: in 2 weeks Disposition: Home Discharged Condition: stable   Discharge Instructions     Call MD / Call 911   Complete by: As directed    If you experience chest pain or shortness of breath, CALL 911 and be transported to the hospital emergency room.  If you develope a fever above 101 F, pus (white drainage) or increased drainage or redness at the wound, or calf pain, call your surgeon's office.   Change dressing   Complete by: As directed    You have an adhesive waterproof bandage over the incision. Leave this in place until your first follow-up appointment. Once you remove this you will not need to place another bandage.   Constipation Prevention   Complete by: As directed    Drink plenty of fluids.  Prune juice may be helpful.  You may use a stool softener, such as Colace (over the counter) 100 mg twice a day.  Use MiraLax (over the counter) for constipation as needed.   Diet - low sodium heart healthy   Complete by: As directed    Do not sit on low chairs, stoools or toilet seats, as it may be difficult to get up from low surfaces   Complete by: As directed    Driving restrictions    Complete by: As directed    No driving for two weeks   Post-operative opioid taper instructions:   Complete by: As directed    POST-OPERATIVE OPIOID TAPER INSTRUCTIONS: It is important to wean off of your opioid medication as soon as possible. If you do not need pain medication after your surgery it is ok to stop day one. Opioids include: Codeine, Hydrocodone(Norco, Vicodin), Oxycodone(Percocet, oxycontin) and hydromorphone amongst others.  Long term and even short term use of opiods can cause: Increased pain response Dependence Constipation Depression Respiratory depression And more.  Withdrawal symptoms can include Flu like symptoms Nausea, vomiting And more Techniques to manage these symptoms Hydrate well Eat regular healthy meals Stay active Use relaxation techniques(deep breathing, meditating, yoga) Do Not substitute Alcohol to help with tapering If you have been on opioids for less than two weeks and do not have pain than it is ok to stop all together.  Plan to wean off of opioids This plan should start within one week post op of your joint replacement. Maintain the same interval or time between taking each dose and first decrease the dose.  Cut the total daily intake of opioids by one tablet each day Next start to increase the time between doses. The last dose that should be eliminated is the evening dose.      TED hose   Complete by: As directed    Use stockings (TED hose) for three weeks on both leg(s).  You may remove them at night for sleeping.   Weight bearing as tolerated   Complete by: As directed       Allergies as of 04/19/2023       Reactions   Statins Other (See Comments)   Muscle  aches, tolerating pravastatin         Medication List     STOP taking these medications    aspirin EC 81 MG tablet       TAKE these medications    acetaminophen 500 MG tablet Commonly known as: TYLENOL Take 500 mg by mouth every 6 (six) hours as needed for  moderate pain.   amoxicillin-clavulanate 500-125 MG tablet Commonly known as: AUGMENTIN Take 1 tablet by mouth 2 (two) times daily.   cyanocobalamin 1000 MCG tablet Commonly known as: VITAMIN B12 Take 1,000 mcg by mouth daily.   ezetimibe 10 MG tablet Commonly known as: ZETIA Take 1 tablet (10 mg total) by mouth daily.   HYDROcodone-acetaminophen 5-325 MG tablet Commonly known as: NORCO/VICODIN Take 1-2 tablets by mouth every 6 (six) hours as needed for severe pain.   methocarbamol 500 MG tablet Commonly known as: ROBAXIN Take 1 tablet (500 mg total) by mouth every 6 (six) hours as needed for muscle spasms.   ondansetron 4 MG tablet Commonly known as: ZOFRAN Take 1 tablet (4 mg total) by mouth every 6 (six) hours as needed for nausea or vomiting.   pravastatin 20 MG tablet Commonly known as: PRAVACHOL Take 1 tablet by mouth daily.   rivaroxaban 10 MG Tabs tablet Commonly known as: XARELTO Take 1 tablet (10 mg total) by mouth daily with breakfast. Then resume an 81 mg aspirin once a day   SUPER B COMPLEX/C PO Take 1 tablet by mouth daily.   traMADol 50 MG tablet Commonly known as: ULTRAM Take 1-2 tablets (50-100 mg total) by mouth every 6 (six) hours as needed for moderate pain. What changed:  how much to take when to take this reasons to take this   Vitamin D 50 MCG (2000 UT) tablet Take 2,000 Units by mouth daily.               Discharge Care Instructions  (From admission, onward)           Start     Ordered   04/19/23 0000  Weight bearing as tolerated        04/19/23 0751   04/19/23 0000  Change dressing       Comments: You have an adhesive waterproof bandage over the incision. Leave this in place until your first follow-up appointment. Once you remove this you will not need to place another bandage.   04/19/23 0751            Follow-up Information     Ollen Gross, MD. Schedule an appointment as soon as possible for a visit in 2  week(s).   Specialty: Orthopedic Surgery Contact information: [address] [place] [address] [place] [address] [phone]                 Signed: R. Arcola Jansky, PA-C Orthopedic Surgery 04/20/2023, 7:59 AM
